# Patient Record
Sex: Female | Born: 1935 | Race: White | Hispanic: No | Marital: Married | Smoking: Never smoker
Health system: Southern US, Community
[De-identification: ages and names within clinical notes are randomized; demographics above are authoritative.]

## PROBLEM LIST (undated history)

## (undated) DIAGNOSIS — M1990 Unspecified osteoarthritis, unspecified site: Secondary | ICD-10-CM

## (undated) DIAGNOSIS — R569 Unspecified convulsions: Secondary | ICD-10-CM

## (undated) DIAGNOSIS — I10 Essential (primary) hypertension: Secondary | ICD-10-CM

## (undated) DIAGNOSIS — F32A Depression, unspecified: Secondary | ICD-10-CM

## (undated) DIAGNOSIS — I509 Heart failure, unspecified: Secondary | ICD-10-CM

## (undated) DIAGNOSIS — E039 Hypothyroidism, unspecified: Secondary | ICD-10-CM

## (undated) DIAGNOSIS — F329 Major depressive disorder, single episode, unspecified: Secondary | ICD-10-CM

## (undated) DIAGNOSIS — E785 Hyperlipidemia, unspecified: Secondary | ICD-10-CM

## (undated) DIAGNOSIS — M109 Gout, unspecified: Secondary | ICD-10-CM

## (undated) DIAGNOSIS — N189 Chronic kidney disease, unspecified: Secondary | ICD-10-CM

## (undated) DIAGNOSIS — J449 Chronic obstructive pulmonary disease, unspecified: Secondary | ICD-10-CM

## (undated) HISTORY — PX: APPENDECTOMY: SHX54

## (undated) HISTORY — DX: Heart failure, unspecified: I50.9

## (undated) HISTORY — DX: Depression, unspecified: F32.A

## (undated) HISTORY — PX: PARTIAL HIP ARTHROPLASTY: SHX733

## (undated) HISTORY — DX: Hypothyroidism, unspecified: E03.9

## (undated) HISTORY — DX: Unspecified osteoarthritis, unspecified site: M19.90

## (undated) HISTORY — DX: Essential (primary) hypertension: I10

## (undated) HISTORY — DX: Major depressive disorder, single episode, unspecified: F32.9

## (undated) HISTORY — DX: Chronic obstructive pulmonary disease, unspecified: J44.9

## (undated) HISTORY — DX: Unspecified convulsions: R56.9

## (undated) HISTORY — DX: Gout, unspecified: M10.9

## (undated) HISTORY — PX: CHOLECYSTECTOMY: SHX55

## (undated) HISTORY — DX: Chronic kidney disease, unspecified: N18.9

## (undated) HISTORY — DX: Hyperlipidemia, unspecified: E78.5

---

## 2006-09-22 ENCOUNTER — Other Ambulatory Visit: Payer: Self-pay

## 2006-09-22 ENCOUNTER — Emergency Department: Payer: Self-pay | Admitting: General Practice

## 2006-10-11 ENCOUNTER — Other Ambulatory Visit: Payer: Self-pay

## 2006-10-11 ENCOUNTER — Emergency Department: Payer: Self-pay | Admitting: Emergency Medicine

## 2006-10-24 ENCOUNTER — Other Ambulatory Visit: Payer: Self-pay

## 2006-10-24 ENCOUNTER — Inpatient Hospital Stay: Payer: Self-pay | Admitting: Internal Medicine

## 2007-03-18 ENCOUNTER — Emergency Department: Payer: Self-pay | Admitting: Emergency Medicine

## 2007-03-18 ENCOUNTER — Other Ambulatory Visit: Payer: Self-pay

## 2007-07-04 ENCOUNTER — Ambulatory Visit: Payer: Self-pay | Admitting: Oncology

## 2007-07-28 ENCOUNTER — Ambulatory Visit: Payer: Self-pay | Admitting: Oncology

## 2007-08-03 ENCOUNTER — Ambulatory Visit: Payer: Self-pay | Admitting: Oncology

## 2007-11-27 ENCOUNTER — Emergency Department: Payer: Self-pay | Admitting: Emergency Medicine

## 2007-11-27 ENCOUNTER — Other Ambulatory Visit: Payer: Self-pay

## 2007-12-02 ENCOUNTER — Ambulatory Visit: Payer: Self-pay | Admitting: Oncology

## 2007-12-30 ENCOUNTER — Ambulatory Visit: Payer: Self-pay | Admitting: Oncology

## 2008-01-01 ENCOUNTER — Ambulatory Visit: Payer: Self-pay | Admitting: Oncology

## 2008-01-07 ENCOUNTER — Inpatient Hospital Stay: Payer: Self-pay | Admitting: Internal Medicine

## 2008-01-07 ENCOUNTER — Other Ambulatory Visit: Payer: Self-pay

## 2008-02-09 ENCOUNTER — Ambulatory Visit: Payer: Self-pay | Admitting: Unknown Physician Specialty

## 2008-02-15 ENCOUNTER — Inpatient Hospital Stay: Payer: Self-pay | Admitting: Unknown Physician Specialty

## 2008-03-24 ENCOUNTER — Inpatient Hospital Stay: Payer: Self-pay | Admitting: Internal Medicine

## 2008-03-24 ENCOUNTER — Other Ambulatory Visit: Payer: Self-pay

## 2008-05-05 ENCOUNTER — Emergency Department: Payer: Self-pay | Admitting: Emergency Medicine

## 2008-08-10 ENCOUNTER — Ambulatory Visit: Payer: Self-pay | Admitting: Unknown Physician Specialty

## 2008-08-11 ENCOUNTER — Inpatient Hospital Stay: Payer: Self-pay | Admitting: Unknown Physician Specialty

## 2008-10-11 ENCOUNTER — Ambulatory Visit: Payer: Self-pay | Admitting: Unknown Physician Specialty

## 2009-02-22 ENCOUNTER — Encounter (HOSPITAL_BASED_OUTPATIENT_CLINIC_OR_DEPARTMENT_OTHER): Admission: RE | Admit: 2009-02-22 | Discharge: 2009-04-04 | Payer: Self-pay | Admitting: General Surgery

## 2009-02-24 ENCOUNTER — Ambulatory Visit (HOSPITAL_COMMUNITY): Admission: RE | Admit: 2009-02-24 | Discharge: 2009-02-24 | Payer: Self-pay | Admitting: General Surgery

## 2009-03-30 ENCOUNTER — Inpatient Hospital Stay (HOSPITAL_COMMUNITY): Admission: RE | Admit: 2009-03-30 | Discharge: 2009-04-04 | Payer: Self-pay | Admitting: Orthopedic Surgery

## 2009-04-07 ENCOUNTER — Ambulatory Visit: Payer: Self-pay | Admitting: Family Medicine

## 2009-04-08 ENCOUNTER — Ambulatory Visit: Payer: Self-pay | Admitting: Obstetrics and Gynecology

## 2009-04-20 ENCOUNTER — Inpatient Hospital Stay: Payer: Self-pay | Admitting: Internal Medicine

## 2009-05-02 ENCOUNTER — Emergency Department: Payer: Self-pay | Admitting: Emergency Medicine

## 2009-06-14 ENCOUNTER — Emergency Department: Payer: Self-pay | Admitting: Emergency Medicine

## 2009-10-31 ENCOUNTER — Emergency Department: Payer: Self-pay | Admitting: Internal Medicine

## 2010-05-31 ENCOUNTER — Inpatient Hospital Stay: Payer: Self-pay | Admitting: Internal Medicine

## 2010-06-14 ENCOUNTER — Emergency Department: Payer: Self-pay | Admitting: Emergency Medicine

## 2010-07-09 ENCOUNTER — Emergency Department: Payer: Self-pay | Admitting: Emergency Medicine

## 2010-07-29 IMAGING — CR DG HIP 1V PORT*R*
1 series · 1 of 1 positions shown · non-contrast
Comparison: 02/24/2009.

CLINICAL DATA: Abscess.  Status post right hip revision.

PORTABLE RIGHT HIP - 1 VIEW

[view not recorded]
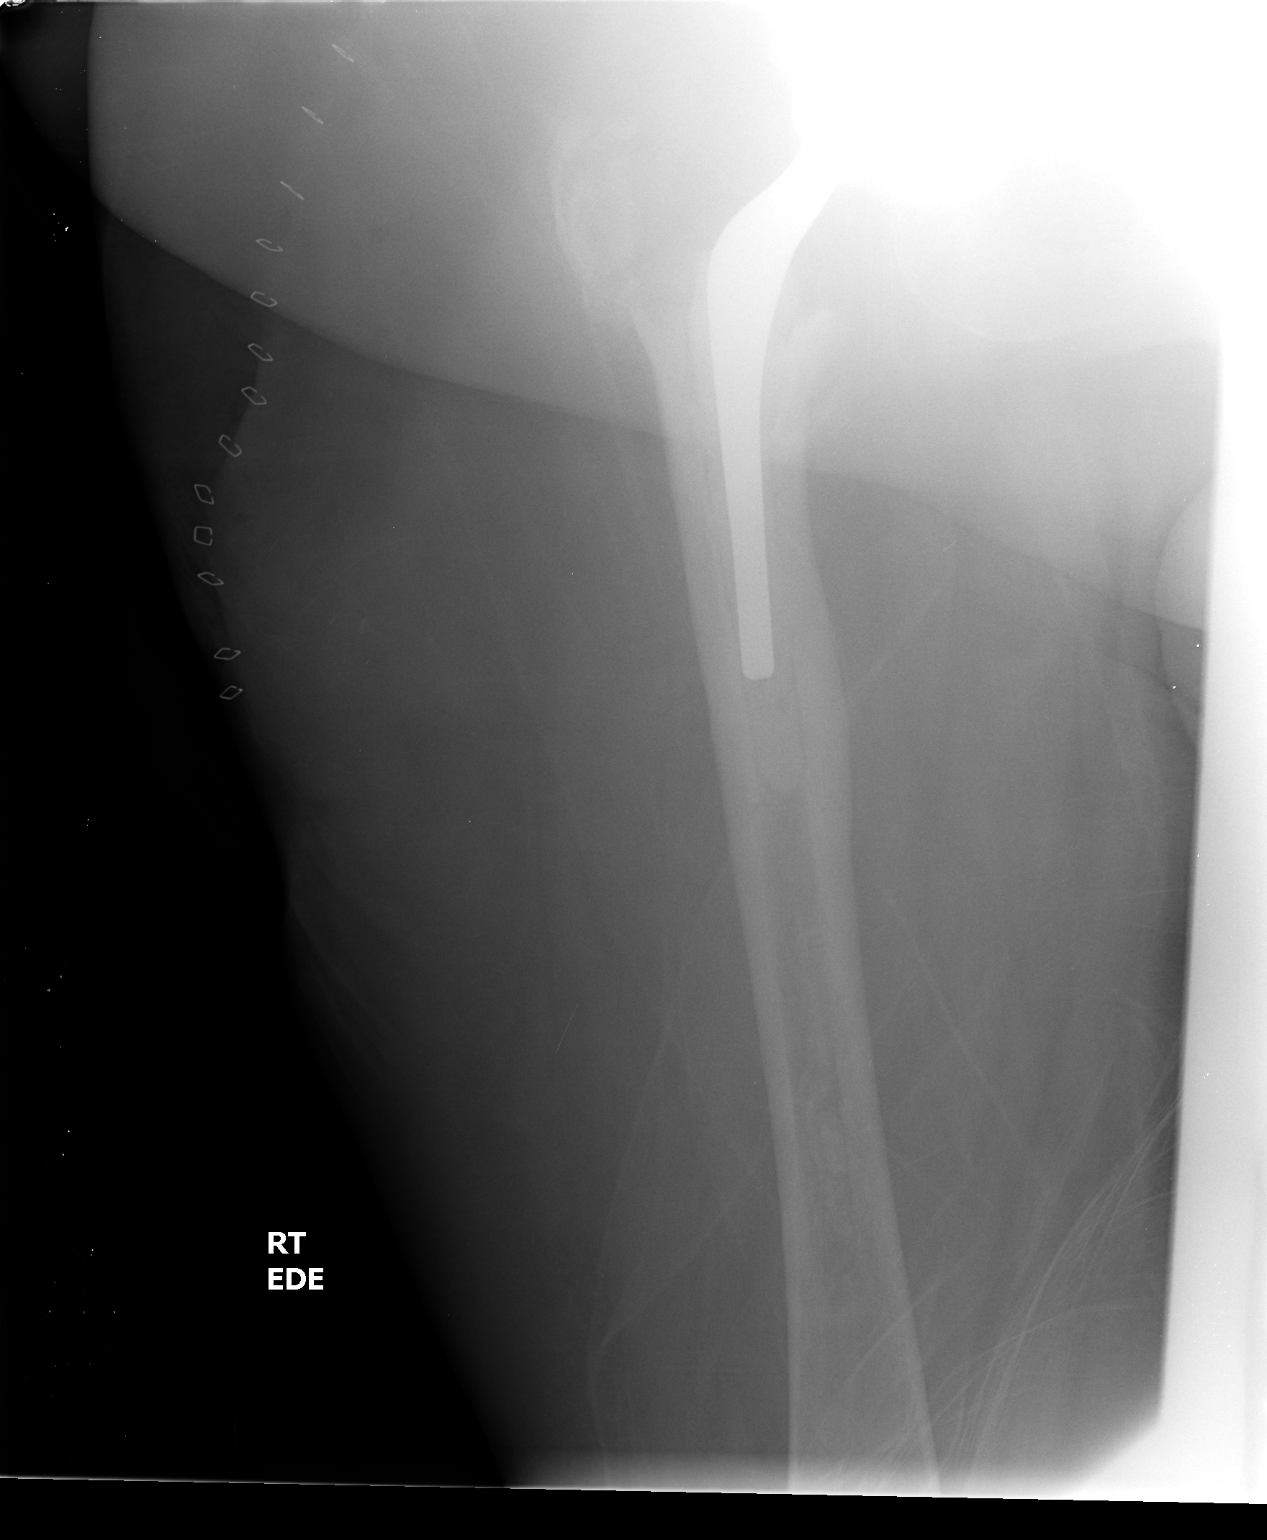

[1 of 1 positions shown; findings below may reference images not displayed]

FINDINGS: Right hip prosthesis has been revised.  Lucency
surrounding the acetabular component may represent nonradiopaque
aspect of this prosthesis.  Femoral component imbedded in
radiopaque material.  Right greater trochanter displaced laterally.
IMPRESSION: Revision right hip prosthesis.

Right trochanter is displaced laterally.

## 2010-08-01 IMAGING — CR DG CHEST 1V PORT
1 series · 1 of 1 positions shown · non-contrast
Comparison: 03/31/2009

CLINICAL DATA: Evaluate PICC line placement.  History of an
abscess.

PORTABLE CHEST - 1 VIEW

[view not recorded]
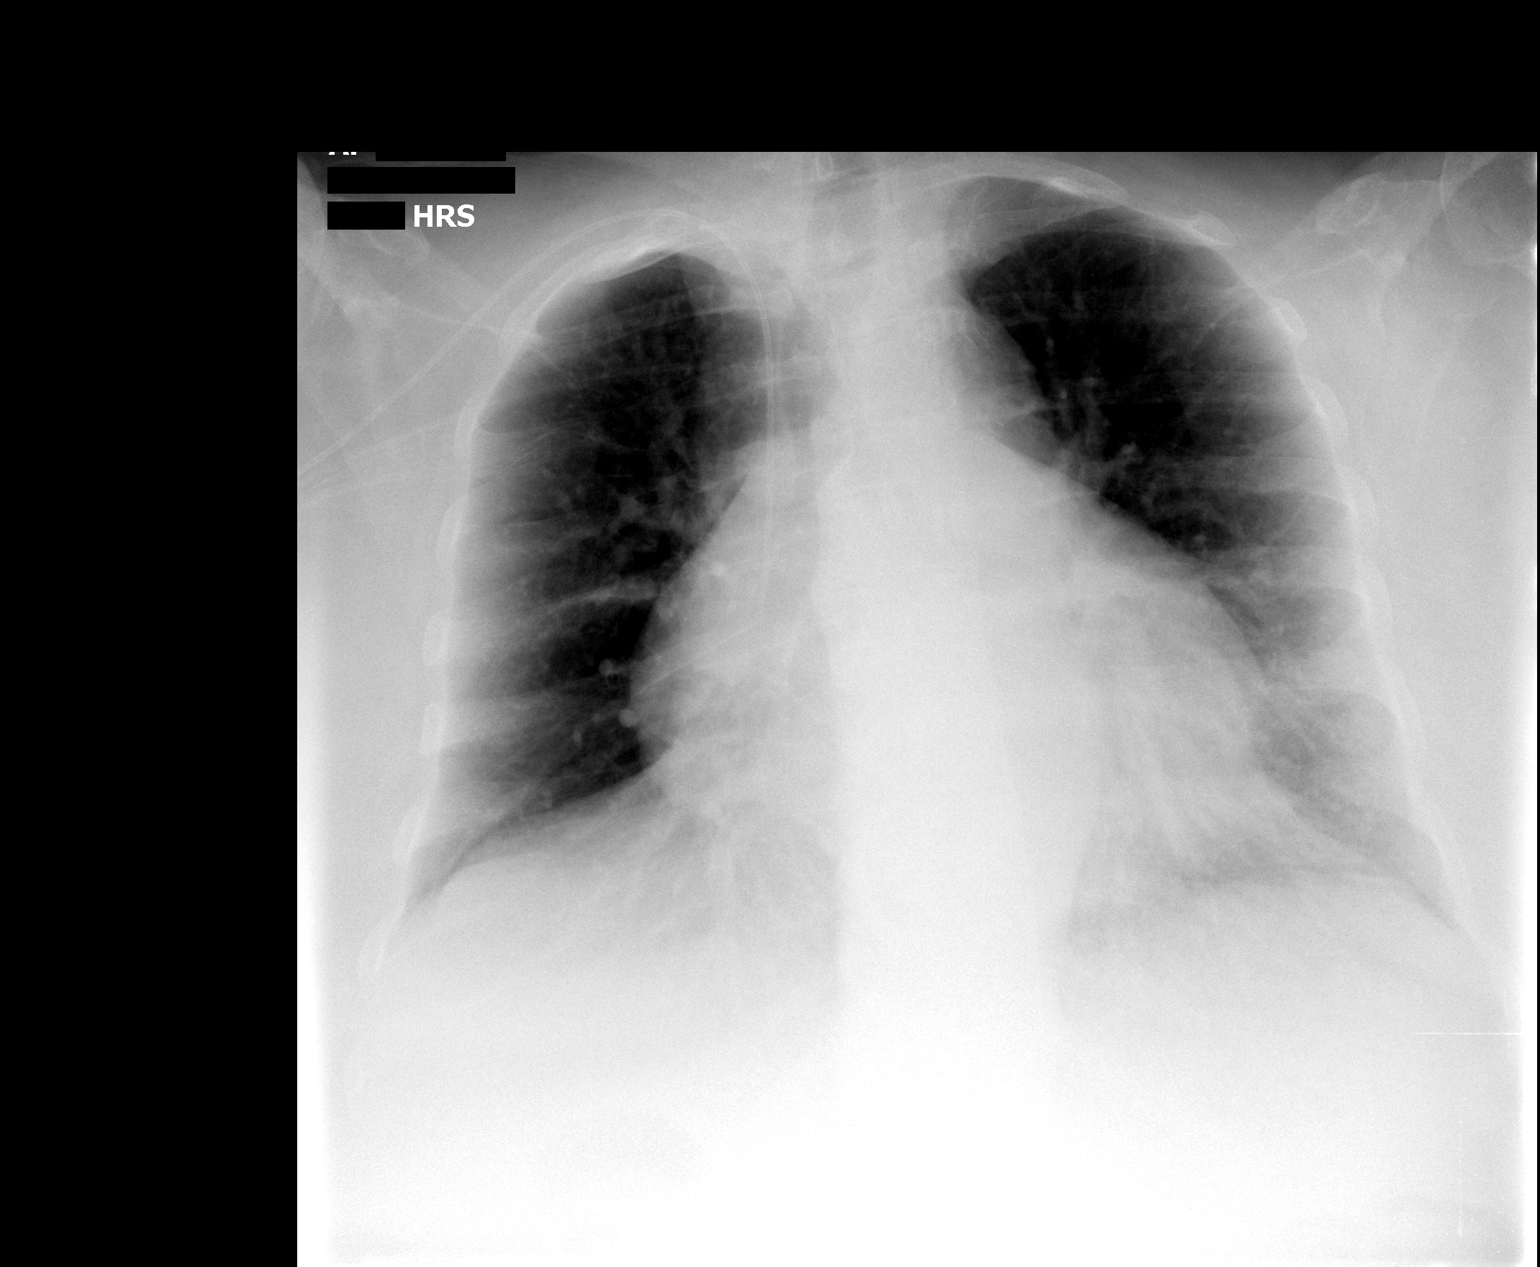

[1 of 1 positions shown; findings below may reference images not displayed]

FINDINGS: Portable view of the chest demonstrates a right sided
PICC line.  The PICC line terminates in the distal SVC.  No
significant change in the lung fields.  Cardiac silhouette remains
upper limits of normal.  Difficult to exclude atelectasis in the
left lung base.
IMPRESSION: PICC line tip in the distal SVC.

## 2010-12-08 LAB — GLUCOSE, CAPILLARY
Glucose-Capillary: 105 mg/dL — ABNORMAL HIGH (ref 70–99)
Glucose-Capillary: 107 mg/dL — ABNORMAL HIGH (ref 70–99)
Glucose-Capillary: 109 mg/dL — ABNORMAL HIGH (ref 70–99)
Glucose-Capillary: 111 mg/dL — ABNORMAL HIGH (ref 70–99)
Glucose-Capillary: 119 mg/dL — ABNORMAL HIGH (ref 70–99)
Glucose-Capillary: 120 mg/dL — ABNORMAL HIGH (ref 70–99)
Glucose-Capillary: 125 mg/dL — ABNORMAL HIGH (ref 70–99)

## 2010-12-08 LAB — PROTIME-INR
INR: 1.3 (ref 0.00–1.49)
Prothrombin Time: 16.8 seconds — ABNORMAL HIGH (ref 11.6–15.2)
Prothrombin Time: 21.6 seconds — ABNORMAL HIGH (ref 11.6–15.2)

## 2010-12-08 LAB — CBC
MCHC: 34.3 g/dL (ref 30.0–36.0)
MCV: 90.4 fL (ref 78.0–100.0)
Platelets: 121 10*3/uL — ABNORMAL LOW (ref 150–400)
RBC: 3.18 MIL/uL — ABNORMAL LOW (ref 3.87–5.11)
RDW: 16.3 % — ABNORMAL HIGH (ref 11.5–15.5)

## 2010-12-08 LAB — BASIC METABOLIC PANEL
BUN: 28 mg/dL — ABNORMAL HIGH (ref 6–23)
CO2: 24 mEq/L (ref 19–32)
Calcium: 8.4 mg/dL (ref 8.4–10.5)
Creatinine, Ser: 1.08 mg/dL (ref 0.4–1.2)
GFR calc Af Amer: 60 mL/min — ABNORMAL LOW (ref >60)

## 2010-12-09 LAB — HEMOGLOBIN A1C
Hgb A1c MFr Bld: 5.4 % (ref 4.6–6.1)
Mean Plasma Glucose: 108 mg/dL

## 2010-12-09 LAB — BASIC METABOLIC PANEL
CO2: 26 mEq/L (ref 19–32)
Calcium: 7.7 mg/dL — ABNORMAL LOW (ref 8.4–10.5)
Calcium: 7.8 mg/dL — ABNORMAL LOW (ref 8.4–10.5)
Chloride: 103 mEq/L (ref 96–112)
Creatinine, Ser: 0.86 mg/dL (ref 0.4–1.2)
GFR calc Af Amer: 53 mL/min — ABNORMAL LOW (ref >60)
GFR calc Af Amer: >60 mL/min (ref >60)
GFR calc non Af Amer: >60 mL/min (ref >60)
Glucose, Bld: 121 mg/dL — ABNORMAL HIGH (ref 70–99)
Sodium: 131 mEq/L — ABNORMAL LOW (ref 135–145)
Sodium: 134 mEq/L — ABNORMAL LOW (ref 135–145)

## 2010-12-09 LAB — CROSSMATCH

## 2010-12-09 LAB — COMPREHENSIVE METABOLIC PANEL
AST: 24 U/L (ref 0–37)
Albumin: 3.4 g/dL — ABNORMAL LOW (ref 3.5–5.2)
Alkaline Phosphatase: 172 U/L — ABNORMAL HIGH (ref 39–117)
BUN: 33 mg/dL — ABNORMAL HIGH (ref 6–23)
CO2: 27 mEq/L (ref 19–32)
Chloride: 108 mEq/L (ref 96–112)
Creatinine, Ser: 0.89 mg/dL (ref 0.4–1.2)
GFR calc Af Amer: >60 mL/min (ref >60)
GFR calc non Af Amer: >60 mL/min (ref >60)
Potassium: 4.5 mEq/L (ref 3.5–5.1)
Total Bilirubin: 0.3 mg/dL (ref 0.3–1.2)

## 2010-12-09 LAB — CBC
HCT: 30.6 % — ABNORMAL LOW (ref 36.0–46.0)
Hemoglobin: 7.8 g/dL — CL (ref 12.0–15.0)
Hemoglobin: 9.9 g/dL — ABNORMAL LOW (ref 12.0–15.0)
MCHC: 34.8 g/dL (ref 30.0–36.0)
MCV: 90.5 fL (ref 78.0–100.0)
MCV: 91.4 fL (ref 78.0–100.0)
Platelets: 169 10*3/uL (ref 150–400)
Platelets: 249 10*3/uL (ref 150–400)
RBC: 2.48 MIL/uL — ABNORMAL LOW (ref 3.87–5.11)
RBC: 3.2 MIL/uL — ABNORMAL LOW (ref 3.87–5.11)
RBC: 3.35 MIL/uL — ABNORMAL LOW (ref 3.87–5.11)
RDW: 16.2 % — ABNORMAL HIGH (ref 11.5–15.5)
RDW: 16.5 % — ABNORMAL HIGH (ref 11.5–15.5)
RDW: 16.6 % — ABNORMAL HIGH (ref 11.5–15.5)
WBC: 7.2 10*3/uL (ref 4.0–10.5)
WBC: 9.7 10*3/uL (ref 4.0–10.5)

## 2010-12-09 LAB — GRAM STAIN

## 2010-12-09 LAB — GLUCOSE, CAPILLARY
Glucose-Capillary: 103 mg/dL — ABNORMAL HIGH (ref 70–99)
Glucose-Capillary: 131 mg/dL — ABNORMAL HIGH (ref 70–99)
Glucose-Capillary: 145 mg/dL — ABNORMAL HIGH (ref 70–99)
Glucose-Capillary: 167 mg/dL — ABNORMAL HIGH (ref 70–99)
Glucose-Capillary: 90 mg/dL (ref 70–99)
Glucose-Capillary: 97 mg/dL (ref 70–99)

## 2010-12-09 LAB — ANAEROBIC CULTURE

## 2010-12-09 LAB — POCT I-STAT 4, (NA,K, GLUC, HGB,HCT)
Glucose, Bld: 148 mg/dL — ABNORMAL HIGH (ref 70–99)
HCT: 23 % — ABNORMAL LOW (ref 36.0–46.0)
HCT: 27 % — ABNORMAL LOW (ref 36.0–46.0)
Hemoglobin: 7.8 g/dL — CL (ref 12.0–15.0)
Sodium: 135 mEq/L (ref 135–145)

## 2010-12-09 LAB — VANCOMYCIN, TROUGH: Vancomycin Tr: 18.3 ug/mL (ref 10.0–20.0)

## 2010-12-09 LAB — WOUND CULTURE

## 2010-12-09 LAB — ABO/RH: ABO/RH(D): A POS

## 2010-12-09 LAB — PROTIME-INR
INR: 1.4 (ref 0.00–1.49)
Prothrombin Time: 16.2 seconds — ABNORMAL HIGH (ref 11.6–15.2)

## 2011-01-15 NOTE — Assessment & Plan Note (Signed)
[facility]   NAME:  Gabrielle Sanders, Gabrielle Sanders            ACCOUNT NO.:  [id number]   MEDICAL RECORD NO.:  [id number]      DATE OF BIRTH:  04/14/1931   PHYSICIAN:  Maxwell Caul, M.D. VISIT DATE:  03/02/2009                                   OFFICE VISIT   HISTORY:  [name] was seen last week by Dr. Wiliam Ke.  She is 77-  year-old woman who underwent a total hip replacement 1 year ago at  [facility] by Dr. Gerrit Heck.  The wound never properly healed.  It  was apparently drained several months ago and then she had an elective  opening of the wound where she was treated with a wound vac but this has  still left her with a draining sinus with considerable amount of depth.   Dr. Wiliam Ke biopsied some of the surrounding tissue which simply showed  benign soft tissue with acute inflammation and necrosis.  A culture of  the drainage grew few staph aureus (MSSA).  She is currently on  doxycycline.   A fistulogram was done at [facility].  This shows a  fistula that extends from the skin surface all the way down to the  lateral aspect of the proximal right femur where there was a collection  in the deep soft tissues, likely a chronic abscess cavity.  It does not  appear to connect directly with the hip joint as there is no contrast  extension into the hip joint space.   PHYSICAL EXAMINATION:  The wound itself is a very tiny wound but probes  very deeply.  Our measurements today were 0.5 x 0.5 x 6.5.  This does  not probe to bone.  Palpation around the soft tissue results in  purulence from the fistula tract opening.  There is no gross soft tissue  involvement.  She does not appear to be systemically ill with a  temperature of 98.6.   IMPRESSION:  Fistula right extending from the surface of the skin in the  middle part of incision down the lateral aspect of proximal right femur  where it connects likely with an abscess cavity.  I think this will  need  to be reopened.  I do not think antibiotics will ultimately result in  healing here.  I am going to refer her to orthopedics for an opinion.  The patient is adamantly against returning to see Dr. Gerrit Heck.  I will  refer her to see Dr. Lajoyce Corners.  She does have an appointment in [place]  on May 04, 2009, although I do not think this should be left that  long for definitive surgical treatment.           ______________________________  Maxwell Caul, M.D.     MGR/MEDQ  D:  03/02/2009  T:  03/03/2009  Job:  [id number]

## 2011-01-15 NOTE — Op Note (Signed)
NAME:  Gabrielle Sanders, Gabrielle Sanders            ACCOUNT NO.:  [id number]   MEDICAL RECORD NO.:  [id number]          PATIENT TYPE:  INP   LOCATION:  5038                         FACILITY:  [facility]   PHYSICIAN:  Nadara Mustard, MD     DATE OF BIRTH:  1931/05/23   DATE OF PROCEDURE:  03/30/2009  DATE OF DISCHARGE:                               OPERATIVE REPORT   PREOPERATIVE DIAGNOSIS:  Septic right total hip arthroplasty.   POSTOPERATIVE DIAGNOSIS:  Septic right total hip arthroplasty.   PROCEDURES:  1. Irrigation and debridement of large septic abscess, right hip.  2. Removal of right total hip arthroplasty including femoral      component, which was cemented acetabular component.  3. Placement of an antibiotic-coated total hip replacement with a 42-      mm acetabulum cemented in place with bone cement and 1 g of      tobramycin and 1 g of vancomycin.  4. Implantation of a size 1 stem coated with 1 g of vancomycin and 1 g      of tobramycin and placement of +9 neck and a 32-mm semiconstrained      head.   SURGEON:  Nadara Mustard, MD   ANESTHESIA:  General.   ESTIMATED BLOOD LOSS:  1000 mL.   ANTIBIOTICS:  One gram of Kefzol.   DRAINS:  None.   COMPLICATIONS:  None.   The patient received 2 units of packed red blood cells.   DISPOSITION:  To PACU in stable condition.   INDICATION OF PROCEDURE:  The patient is a 77 year old woman who is 1-  year status post a total hip arthroplasty in [place], [place].  The patient was treated postoperatively with a draining sinus tract with  wound care, VAC placement and treatment at a [facility].  Radiograph showed lucency around the implants consistent with the 1-year  history of infection and the patient presents at this time for the above-  mentioned procedure.  Risks and benefits were discussed including  persistent infection, neurovascular injury, injury to the sciatic nerve,  dislocation of the hip, DVT, pulmonary embolus,  mortality, need for  additional surgery.  The patient and family state they understand and  wished to proceed at this time.   DESCRIPTION OF PROCEDURE:  The patient was brought to OR room 15,  underwent general anesthetic.  After adequate level of anesthesia  obtained, the patient was placed in left lateral decubitus position with  the right side up and the right lower extremities prepped using DuraPrep  and draped in the sterile field.  Ioban used to cover all exposed skin.  The draining sinus tract was ellipsed out in 1 block of tissue.  The  posterolateral incision was used and this was carried down through the  tensor fascia lata, which was split.  The Ethibond sutures were removed.  Visualization showed a massive deep abscess.  Cultures were obtained.  This was irrigated with pulsatile lavage.  There was seaweed-like frondy  inflammatory tissue, which was excised.  There was 4 large heterotopic  ossification masses, which were also excised.  Attention  was focused  first on the femur.  Using the [facility] instruments, the instruments  were taken around the femur and the femur was then extracted with the  extraction tool.  Further tools were used to remove the cement mantle  with reaming, irrigation, debridement and the canal was broached for a  size 2 stem.  Attention was then focused on the acetabulum.  The  acetabular poly liner was removed.  The 2 locking screws were removed  and the acetabulum came out quite easily after the screws were removed.  There was no interdigitation into the acetabular component.  The  acetabulum was curetted back to bleeding viable subchondral bone.  The  wounds were again irrigated with pulsatile lavage.  There was a slight  crack in the greater trochanter, but this was still attached with the  lateral ligamentous structures.  All heterotopic ossification masses  were also removed.  After further irrigation, the components were  fabricated on the back  table.  The cement mantle with antibiotics was  placed in the mold for the femur and a component of cement was used for  the acetabular poly liner.  The femur was press fit in place with the  cement mantle with approximately 20 degrees of anteversion.  After the  cement had almost completely hardened, the acetabular component was  press fit with 40 degrees of abduction and 20 degrees of anteversion.  After the cement had hardened, this was trialed with 7.5 and this had a  good length.  The +8 neck and the 32-mm head were inserted.  The hip was  reduced.  The hip was placed through full range of motion.  There was  good stability.  The patient had flexion down to 20 adduction and  internal rotation of 70 degrees.  The wounds were again further  irrigated.  The tensor fascia lata tissue was closed using a 2-0  monofilament PDS.  The subcu was closed using a 2-0 monofilament PDS.  Skin was closed using Proximate staples.  The wound was covered with  Adaptic orthopedic sponges, ABD dressing, and Hypafix tape.  The patient  was extubated, taken to PACU in stable condition.      Nadara Mustard, MD  Electronically Signed     MVD/MEDQ  D:  03/30/2009  T:  03/31/2009  Job:  [id number] [id number]

## 2011-01-15 NOTE — H&P (Signed)
NAME:  Gabrielle Sanders, Gabrielle Sanders            ACCOUNT NO.:  [id number]   MEDICAL RECORD NO.:  [id number]          PATIENT TYPE:  REC   LOCATION:  FOOT                         FACILITY:  [facility]   PHYSICIAN:  Joanne Gavel, M.D.        DATE OF BIRTH:  [birth year]   DATE OF ADMISSION:  02/22/2009  DATE OF DISCHARGE:                              HISTORY & PHYSICAL   CHIEF COMPLAINT:  Draining wound.   HISTORY OF PRESENT ILLNESS:  This is a 77 year old female who underwent  total hip replacement 1 year ago at [facility] by Dr.  Gerrit Heck.  This wound has never properly healed.  It was drained as an  emergency several months ago, then she had an elective operation where  the wound was opened widely and she was treated with a VAC, but this  still has not resulted in healing.  Several days ago during the course  of packing, a piece of tissue came out.  I have biopsied this tissue  after tying it up with a 2-0 chromic, it appears to be fibrous tissue.   PAST MEDICAL HISTORY:  She has chronic leukemia and has had a stroke a  year ago.  She has been treated for congestive heart failure, high blood  pressure, MI.  She also has some bladder incontinence and difficulty  swallowing.  She has hypothyroidism.   MEDICATIONS:  Levothyroxine, Cozaar, folic acid, furosemide, Spiriva,  Nexium, senna, fexofenadine, allopurinol, B12, Dilantin, Klonopin,  Tylenol, and Percocet.   ALLERGIES:  None.   PAST SURGICAL HISTORY:  She had appendectomy, cholecystectomy, and hip  surgery as above, also tonsillectomy.   FAMILY HISTORY:  Noncontributory.   PHYSICAL EXAMINATION:  VITAL SIGNS:  Temperature 98.3, pulse 72,  respirations 16, blood pressure 140/80.  GENERAL APPEARANCE:  Slightly obese, in no distress.  HEENT:  Normocephalic.  NECK:  Supple.  No murmurs.  CHEST:  Clear.  HEART:  Regular rhythm.  ABDOMEN:  At least grossly normal.  EXTREMITIES:  Not examined.  There is a wound on her right hip in the  midst of a hip replacement wound through which is coming some fibrous  tissue.  This tissue was biopsied after being tied up with 2-0 chromic.  The wound has serous drainage which is not odorous and the wound can be  probed for at least 15 cm.   IMPRESSION:  Failure of healing of a hip replacement wound.   PLAN:  Pack that with silver alginate.  Routine blood work.  I have  ordered a Gastrografin injection of the wound tract to see the size of  the cavity and the location.  I am sure that this patient will require  surgery in the operating room.  We will see her in 1 week and probably  will require general plastic or orthopedic consultation.      Joanne Gavel, M.D.  Electronically Signed     RA/MEDQ  D:  02/22/2009  T:  02/23/2009  Job:  [id number]

## 2011-01-15 NOTE — Discharge Summary (Signed)
NAME:  Gabrielle Sanders, Gabrielle Sanders            ACCOUNT NO.:  [id number]   MEDICAL RECORD NO.:  [id number]          PATIENT TYPE:  INP   LOCATION:  5038                         FACILITY:  [facility]   PHYSICIAN:  Nadara Mustard, MD     DATE OF BIRTH:  1931/06/24   DATE OF ADMISSION:  03/30/2009  DATE OF DISCHARGE:  04/04/2009                               DISCHARGE SUMMARY   FINAL DIAGNOSIS:  Septic arthritis, right total hip arthroplasty.   PROCEDURES:  1. Removal of total hip arthroplasty including acetabular and femoral      components.  2. Insertion of an antibiotic impregnated total hip arthroplasty with      both acetabular and femoral component.   Discharged to skilled nursing in stable condition.   Physical therapy, progressive ambulation, weightbearing as tolerated on  the right.  Total hip precautions.  Follow up with Dr. Lajoyce Corners 3 weeks  after discharge.   Discharge medications include vancomycin 6 weeks through PICC line dosed  per pharmacy.   ADMISSION MEDICATIONS:  1. Synthroid 25 mcg one-half tablet daily.  2. Cozaar 50 mg one daily.  3. Allegra 60 mg one daily.  4. Folic acid 1 mg daily.  5. Lasix 40 mg daily p.r.n.  6. Sertraline 100 mg one-half tablets daily.  7. Spiriva inhaler p.r.n.  8. Nexium 40 mg daily.  9. Senna plus 8.6 mg one b.i.d.  10.Lovastatin 20 mg p.o. q.p.m.  11.Elavil 50 mg at bedtime.  12.Allopurinol 100 mg p.o. q.a.m.  13.Vitamin B12 injection monthly.  14.Dilantin 300 mg p.o. at bedtime.  15.Tylenol 325 mg p.r.n.  16.Klonopin 0.25 mg one-half tablet at bedtime.  17.Milk of magnesia p.r.n.  18.MiraLax capful p.r.n. daily.  19.Stool softener 2 at bedtime p.r.n.  20.Albuterol inhaler q.a.m. and p.r.n.  21.Tylox 1-2 p.o. q.4 h. p.r.n. for pain.  22.Sliding scale NovoLog insulin; CBG 121-150 two units, CBG 151-200      three units, CBG 201-250 five units, CBG 251-300 eight units, CBG      301-350 eleven units, CBG 351-400 fifteen units.  23.Coumadin 1  mg p.o. daily x4 weeks for DVT prophylaxis.  24.Currently vancomycin dose 750 mg IV q.12 h.   HISTORY OF PRESENT ILLNESS:  The patient is a 77 year old woman who is  approximately year status post a right total hip arthroplasty.  She has  had persistent drainage since her surgery.  She was treated  conservatively without resolution of the infection and presented at this  time for revision for the infected total hip arthroplasty.  The  patient's hospital course was essentially unremarkable.  She underwent  her surgery on March 30, 2009, with removal of the total hip components  and implantation of antibiotic cement laden total hip replacement.  She  received 2 units of packed red blood cells intraoperatively.  Cultures  were obtained x2.  The patient postoperatively progressed well.  She did  collateral PICC line accidentally and a PICC line was replaced.  The  patient was not progressing adequately to be able to be discharged to  home and she was scheduled for discharge to skilled nursing facility.  The patient's hemoglobin dropped to 7.8 on April 01, 2009, and she  received 2 additional units of packed red blood cells.  The patient and  family agreed for discharge to short-term skilled nursing.  Her PICC  line was replaced and the patient was discharged to skilled nursing in  stable condition to follow up with Dr. Lajoyce Corners in 3 weeks.      Nadara Mustard, MD  Electronically Signed     MVD/MEDQ  D:  04/04/2009  T:  04/04/2009  Job:  [id number]

## 2011-05-15 ENCOUNTER — Inpatient Hospital Stay: Payer: Self-pay | Admitting: Internal Medicine

## 2011-05-18 DIAGNOSIS — I509 Heart failure, unspecified: Secondary | ICD-10-CM

## 2011-06-02 ENCOUNTER — Emergency Department: Payer: Self-pay | Admitting: Internal Medicine

## 2011-08-05 ENCOUNTER — Ambulatory Visit: Payer: Self-pay | Admitting: Oncology

## 2011-08-13 ENCOUNTER — Ambulatory Visit: Payer: Self-pay | Admitting: Oncology

## 2011-08-22 ENCOUNTER — Ambulatory Visit: Payer: Self-pay | Admitting: Gastroenterology

## 2011-08-26 ENCOUNTER — Inpatient Hospital Stay: Payer: Self-pay | Admitting: *Deleted

## 2011-09-03 ENCOUNTER — Ambulatory Visit: Payer: Self-pay | Admitting: Oncology

## 2011-10-04 ENCOUNTER — Ambulatory Visit: Payer: Self-pay | Admitting: Oncology

## 2011-11-01 ENCOUNTER — Ambulatory Visit: Payer: Self-pay | Admitting: Oncology

## 2011-12-19 ENCOUNTER — Ambulatory Visit: Payer: Self-pay | Admitting: Oncology

## 2012-01-01 ENCOUNTER — Ambulatory Visit: Payer: Self-pay | Admitting: Oncology

## 2012-02-13 ENCOUNTER — Ambulatory Visit: Payer: Self-pay | Admitting: Oncology

## 2012-02-13 LAB — CANCER CENTER HEMOGLOBIN: HGB: 10.3 g/dL — ABNORMAL LOW (ref 12.0–16.0)

## 2012-02-25 ENCOUNTER — Emergency Department: Payer: Self-pay | Admitting: Emergency Medicine

## 2012-02-25 LAB — CBC WITH DIFFERENTIAL/PLATELET
Basophil %: 0.9 %
Eosinophil #: 0 10*3/uL (ref 0.0–0.7)
HCT: 28.8 % — ABNORMAL LOW (ref 35.0–47.0)
Lymphocyte #: 1 10*3/uL (ref 1.0–3.6)
Lymphocyte %: 28.3 %
MCHC: 32.5 g/dL (ref 32.0–36.0)
Monocyte %: 13.1 %
Platelet: 115 10*3/uL — ABNORMAL LOW (ref 150–440)
RDW: 14.1 % (ref 11.5–14.5)

## 2012-02-25 LAB — COMPREHENSIVE METABOLIC PANEL
Albumin: 4.1 g/dL (ref 3.4–5.0)
Alkaline Phosphatase: 137 U/L — ABNORMAL HIGH (ref 50–136)
Anion Gap: 7 (ref 7–16)
BUN: 61 mg/dL — ABNORMAL HIGH (ref 7–18)
Bilirubin,Total: 0.2 mg/dL (ref 0.2–1.0)
Calcium, Total: 8.3 mg/dL — ABNORMAL LOW (ref 8.5–10.1)
Co2: 27 mmol/L (ref 21–32)
Creatinine: 1.64 mg/dL — ABNORMAL HIGH (ref 0.60–1.30)
Glucose: 84 mg/dL (ref 65–99)
Osmolality: 300 (ref 275–301)
Potassium: 4.4 mmol/L (ref 3.5–5.1)
SGOT(AST): 22 U/L (ref 15–37)

## 2012-03-02 ENCOUNTER — Ambulatory Visit: Payer: Self-pay | Admitting: Oncology

## 2012-04-02 ENCOUNTER — Ambulatory Visit: Payer: Self-pay | Admitting: Oncology

## 2012-05-03 ENCOUNTER — Ambulatory Visit: Payer: Self-pay | Admitting: Oncology

## 2012-06-02 ENCOUNTER — Ambulatory Visit: Payer: Self-pay | Admitting: Oncology

## 2012-06-18 ENCOUNTER — Ambulatory Visit: Payer: Self-pay | Admitting: Oncology

## 2012-06-18 LAB — CBC CANCER CENTER
Basophil #: 0 x10 3/mm (ref 0.0–0.1)
Basophil %: 0.9 %
Eosinophil #: 0 x10 3/mm (ref 0.0–0.7)
HGB: 9.2 g/dL — ABNORMAL LOW (ref 12.0–16.0)
Lymphocyte %: 27.5 %
MCHC: 31.4 g/dL — ABNORMAL LOW (ref 32.0–36.0)
Neutrophil %: 57.3 %

## 2012-06-23 ENCOUNTER — Emergency Department: Payer: Self-pay | Admitting: Emergency Medicine

## 2012-06-23 LAB — CBC WITH DIFFERENTIAL/PLATELET
Basophil #: 0 10*3/uL (ref 0.0–0.1)
Basophil %: 0.2 %
Eosinophil #: 0 10*3/uL (ref 0.0–0.7)
Eosinophil %: 0.1 %
HCT: 27.9 % — ABNORMAL LOW (ref 35.0–47.0)
HGB: 9.2 g/dL — ABNORMAL LOW (ref 12.0–16.0)
Lymphocyte #: 0.7 10*3/uL — ABNORMAL LOW (ref 1.0–3.6)
Lymphocyte %: 8.6 %
MCH: 34.6 pg — ABNORMAL HIGH (ref 26.0–34.0)
MCHC: 33.1 g/dL (ref 32.0–36.0)
MCV: 104 fL — ABNORMAL HIGH (ref 80–100)
Monocyte #: 1 x10 3/mm — ABNORMAL HIGH (ref 0.2–0.9)
Monocyte %: 11.2 %
Neutrophil #: 6.8 10*3/uL — ABNORMAL HIGH (ref 1.4–6.5)
Neutrophil %: 79.9 %
Platelet: 136 10*3/uL — ABNORMAL LOW (ref 150–440)
RBC: 2.67 10*6/uL — ABNORMAL LOW (ref 3.80–5.20)
RDW: 14.9 % — ABNORMAL HIGH (ref 11.5–14.5)
WBC: 8.6 10*3/uL (ref 3.6–11.0)

## 2012-06-23 LAB — COMPREHENSIVE METABOLIC PANEL
Albumin: 3.5 g/dL (ref 3.4–5.0)
Anion Gap: 9 (ref 7–16)
BUN: 39 mg/dL — ABNORMAL HIGH (ref 7–18)
Calcium, Total: 7.4 mg/dL — ABNORMAL LOW (ref 8.5–10.1)
Chloride: 112 mmol/L — ABNORMAL HIGH (ref 98–107)
EGFR (African American): 38 — ABNORMAL LOW
Glucose: 114 mg/dL — ABNORMAL HIGH (ref 65–99)
Osmolality: 295 (ref 275–301)
Potassium: 3.7 mmol/L (ref 3.5–5.1)
Sodium: 143 mmol/L (ref 136–145)
Total Protein: 5.9 g/dL — ABNORMAL LOW (ref 6.4–8.2)

## 2012-06-23 LAB — URINALYSIS, COMPLETE
Bacteria: NONE SEEN
Bilirubin,UR: NEGATIVE
Blood: NEGATIVE
Glucose,UR: NEGATIVE mg/dL (ref 0–75)
Ketone: NEGATIVE
Leukocyte Esterase: NEGATIVE
Nitrite: NEGATIVE
Ph: 5 (ref 4.5–8.0)
Protein: NEGATIVE
RBC,UR: 2 /HPF (ref 0–5)
Specific Gravity: 1.011 (ref 1.003–1.030)
Squamous Epithelial: <1
WBC UR: <1 /HPF (ref 0–5)

## 2012-06-29 LAB — CULTURE, BLOOD (SINGLE)

## 2012-07-03 ENCOUNTER — Ambulatory Visit: Payer: Self-pay | Admitting: Oncology

## 2012-07-16 LAB — CBC CANCER CENTER
Basophil %: 0.8 %
Eosinophil %: 3.2 %
HGB: 8.8 g/dL — ABNORMAL LOW (ref 12.0–16.0)
MCH: 33.1 pg (ref 26.0–34.0)
MCV: 105 fL — ABNORMAL HIGH (ref 80–100)
Monocyte %: 14 %
Neutrophil %: 45.6 %
RBC: 2.65 10*6/uL — ABNORMAL LOW (ref 3.80–5.20)
WBC: 3.8 x10 3/mm (ref 3.6–11.0)

## 2012-08-02 ENCOUNTER — Ambulatory Visit: Payer: Self-pay | Admitting: Oncology

## 2012-09-02 ENCOUNTER — Ambulatory Visit: Payer: Self-pay | Admitting: Oncology

## 2012-09-17 LAB — CANCER CENTER HEMOGLOBIN: HGB: 9.7 g/dL — ABNORMAL LOW (ref 12.0–16.0)

## 2012-10-03 ENCOUNTER — Ambulatory Visit: Payer: Self-pay | Admitting: Oncology

## 2012-10-08 LAB — CBC CANCER CENTER
Basophil #: 0 x10 3/mm (ref 0.0–0.1)
Basophil %: 1 %
Eosinophil #: 0 x10 3/mm (ref 0.0–0.7)
Eosinophil %: 1.4 %
HCT: 32.2 % — ABNORMAL LOW (ref 35.0–47.0)
RBC: 3.13 10*6/uL — ABNORMAL LOW (ref 3.80–5.20)

## 2012-10-12 ENCOUNTER — Emergency Department: Payer: Self-pay | Admitting: Unknown Physician Specialty

## 2012-10-12 LAB — CBC WITH DIFFERENTIAL/PLATELET
Basophil %: 1.1 %
HGB: 10.7 g/dL — ABNORMAL LOW (ref 12.0–16.0)
MCH: 33.7 pg (ref 26.0–34.0)
Monocyte %: 10.9 %
Neutrophil #: 2.2 10*3/uL (ref 1.4–6.5)
RDW: 15.3 % — ABNORMAL HIGH (ref 11.5–14.5)
WBC: 3.6 10*3/uL (ref 3.6–11.0)

## 2012-10-12 LAB — PROTIME-INR
INR: 1
Prothrombin Time: 13.1 secs (ref 11.5–14.7)

## 2012-10-12 LAB — BASIC METABOLIC PANEL
Anion Gap: 7 (ref 7–16)
BUN: 57 mg/dL — ABNORMAL HIGH (ref 7–18)
Calcium, Total: 8.8 mg/dL (ref 8.5–10.1)
Co2: 24 mmol/L (ref 21–32)
Osmolality: 300 (ref 275–301)
Potassium: 4.2 mmol/L (ref 3.5–5.1)

## 2012-10-31 ENCOUNTER — Ambulatory Visit: Payer: Self-pay | Admitting: Oncology

## 2012-12-01 ENCOUNTER — Ambulatory Visit: Payer: Self-pay | Admitting: Oncology

## 2012-12-03 LAB — CANCER CENTER HEMOGLOBIN: HGB: 10 g/dL — ABNORMAL LOW (ref 12.0–16.0)

## 2012-12-31 ENCOUNTER — Ambulatory Visit: Payer: Self-pay | Admitting: Oncology

## 2013-01-31 ENCOUNTER — Ambulatory Visit: Payer: Self-pay | Admitting: Oncology

## 2013-03-10 ENCOUNTER — Ambulatory Visit: Payer: Self-pay | Admitting: Oncology

## 2013-04-02 ENCOUNTER — Ambulatory Visit: Payer: Self-pay | Admitting: Oncology

## 2013-05-03 ENCOUNTER — Ambulatory Visit: Payer: Self-pay | Admitting: Oncology

## 2013-06-03 ENCOUNTER — Ambulatory Visit: Payer: Self-pay | Admitting: Oncology

## 2013-07-03 ENCOUNTER — Ambulatory Visit: Payer: Self-pay | Admitting: Oncology

## 2013-07-15 LAB — CANCER CENTER HEMOGLOBIN: HGB: 9.7 g/dL — ABNORMAL LOW (ref 12.0–16.0)

## 2013-08-02 ENCOUNTER — Ambulatory Visit: Payer: Self-pay | Admitting: Oncology

## 2013-09-30 ENCOUNTER — Ambulatory Visit: Payer: Self-pay | Admitting: Oncology

## 2013-09-30 LAB — CANCER CENTER HEMOGLOBIN: HGB: 9.6 g/dL — ABNORMAL LOW (ref 12.0–16.0)

## 2013-10-03 ENCOUNTER — Ambulatory Visit: Payer: Self-pay | Admitting: Oncology

## 2013-11-24 ENCOUNTER — Ambulatory Visit: Payer: Self-pay | Admitting: Oncology

## 2013-11-25 LAB — CANCER CENTER HEMOGLOBIN: HGB: 10.2 g/dL — ABNORMAL LOW (ref 12.0–16.0)

## 2013-12-01 ENCOUNTER — Ambulatory Visit: Payer: Self-pay | Admitting: Oncology

## 2013-12-23 LAB — CANCER CENTER HEMOGLOBIN: HGB: 9.3 g/dL — AB (ref 12.0–16.0)

## 2013-12-31 ENCOUNTER — Ambulatory Visit: Payer: Self-pay | Admitting: Oncology

## 2014-01-27 LAB — CBC CANCER CENTER
Basophil #: 0 x10 3/mm (ref 0.0–0.1)
Basophil %: 1 %
Eosinophil #: 0.1 x10 3/mm (ref 0.0–0.7)
Eosinophil %: 2.1 %
HCT: 32.5 % — ABNORMAL LOW (ref 35.0–47.0)
HGB: 10.6 g/dL — ABNORMAL LOW (ref 12.0–16.0)
LYMPHS PCT: 16.7 %
Lymphocyte #: 0.7 x10 3/mm — ABNORMAL LOW (ref 1.0–3.6)
MCH: 34.8 pg — ABNORMAL HIGH (ref 26.0–34.0)
MCHC: 32.6 g/dL (ref 32.0–36.0)
MCV: 107 fL — ABNORMAL HIGH (ref 80–100)
MONO ABS: 0.4 x10 3/mm (ref 0.2–0.9)
MONOS PCT: 10.2 %
NEUTROS ABS: 3 x10 3/mm (ref 1.4–6.5)
NEUTROS PCT: 70 %
Platelet: 145 x10 3/mm — ABNORMAL LOW (ref 150–440)
RBC: 3.04 10*6/uL — AB (ref 3.80–5.20)
RDW: 14.1 % (ref 11.5–14.5)
WBC: 4.2 x10 3/mm (ref 3.6–11.0)

## 2014-01-31 ENCOUNTER — Ambulatory Visit: Payer: Self-pay | Admitting: Oncology

## 2014-03-08 ENCOUNTER — Encounter: Payer: Self-pay | Admitting: Family Medicine

## 2014-03-29 ENCOUNTER — Ambulatory Visit: Payer: Self-pay | Admitting: Oncology

## 2014-03-29 LAB — CANCER CENTER HEMOGLOBIN: HGB: 9.7 g/dL — AB (ref 12.0–16.0)

## 2014-04-02 ENCOUNTER — Encounter: Payer: Self-pay | Admitting: Family Medicine

## 2014-04-02 ENCOUNTER — Ambulatory Visit: Payer: Self-pay | Admitting: Oncology

## 2014-05-20 ENCOUNTER — Emergency Department: Payer: Self-pay | Admitting: Emergency Medicine

## 2014-05-20 LAB — URINALYSIS, COMPLETE
BILIRUBIN, UR: NEGATIVE
BLOOD: NEGATIVE
Bacteria: NONE SEEN
Glucose,UR: NEGATIVE mg/dL (ref 0–75)
Hyaline Cast: 10
Ketone: NEGATIVE
LEUKOCYTE ESTERASE: NEGATIVE
NITRITE: NEGATIVE
PROTEIN: NEGATIVE
Ph: 5 (ref 4.5–8.0)
RBC,UR: <1 /HPF (ref 0–5)
Specific Gravity: 1.011 (ref 1.003–1.030)
Squamous Epithelial: 2
WBC UR: 1 /HPF (ref 0–5)

## 2014-05-20 LAB — CBC
HCT: 27.6 % — AB (ref 35.0–47.0)
HGB: 8.7 g/dL — ABNORMAL LOW (ref 12.0–16.0)
MCH: 33.7 pg (ref 26.0–34.0)
MCHC: 31.6 g/dL — AB (ref 32.0–36.0)
MCV: 107 fL — AB (ref 80–100)
Platelet: 118 10*3/uL — ABNORMAL LOW (ref 150–440)
RBC: 2.59 10*6/uL — ABNORMAL LOW (ref 3.80–5.20)
RDW: 14.4 % (ref 11.5–14.5)
WBC: 3.7 10*3/uL (ref 3.6–11.0)

## 2014-05-20 LAB — BASIC METABOLIC PANEL
ANION GAP: 8 (ref 7–16)
BUN: 56 mg/dL — ABNORMAL HIGH (ref 7–18)
CO2: 24 mmol/L (ref 21–32)
CREATININE: 1.75 mg/dL — AB (ref 0.60–1.30)
Calcium, Total: 7.9 mg/dL — ABNORMAL LOW (ref 8.5–10.1)
Chloride: 111 mmol/L — ABNORMAL HIGH (ref 98–107)
EGFR (African American): 31 — ABNORMAL LOW
EGFR (Non-African Amer.): 27 — ABNORMAL LOW
GLUCOSE: 93 mg/dL (ref 65–99)
Osmolality: 300 (ref 275–301)
Potassium: 4 mmol/L (ref 3.5–5.1)
SODIUM: 143 mmol/L (ref 136–145)

## 2014-05-20 LAB — TROPONIN I

## 2014-05-30 ENCOUNTER — Ambulatory Visit: Payer: Self-pay | Admitting: Oncology

## 2014-05-30 LAB — CANCER CENTER HEMOGLOBIN: HGB: 9.5 g/dL — ABNORMAL LOW (ref 12.0–16.0)

## 2014-06-02 ENCOUNTER — Ambulatory Visit: Payer: Self-pay | Admitting: Oncology

## 2014-07-26 ENCOUNTER — Emergency Department: Payer: Self-pay | Admitting: Emergency Medicine

## 2014-08-01 ENCOUNTER — Ambulatory Visit: Payer: Self-pay | Admitting: Oncology

## 2014-08-01 LAB — CBC CANCER CENTER
BASOS ABS: 0 x10 3/mm (ref 0.0–0.1)
BASOS PCT: 0.8 %
EOS ABS: 0 x10 3/mm (ref 0.0–0.7)
EOS PCT: 0.1 %
HCT: 29.3 % — ABNORMAL LOW (ref 35.0–47.0)
HGB: 9.6 g/dL — ABNORMAL LOW (ref 12.0–16.0)
LYMPHS ABS: 1.1 x10 3/mm (ref 1.0–3.6)
Lymphocyte %: 23.4 %
MCH: 34.2 pg — AB (ref 26.0–34.0)
MCHC: 32.7 g/dL (ref 32.0–36.0)
MCV: 105 fL — ABNORMAL HIGH (ref 80–100)
Monocyte #: 0.5 x10 3/mm (ref 0.2–0.9)
Monocyte %: 11.5 %
NEUTROS ABS: 3 x10 3/mm (ref 1.4–6.5)
NEUTROS PCT: 64.2 %
PLATELETS: 172 x10 3/mm (ref 150–440)
RBC: 2.8 10*6/uL — AB (ref 3.80–5.20)
RDW: 15.1 % — ABNORMAL HIGH (ref 11.5–14.5)
WBC: 4.7 x10 3/mm (ref 3.6–11.0)

## 2014-08-01 LAB — IRON AND TIBC
IRON: 103 ug/dL (ref 50–170)
Iron Bind.Cap.(Total): 222 ug/dL — ABNORMAL LOW (ref 250–450)
Iron Saturation: 46 %
Unbound Iron-Bind.Cap.: 119 ug/dL

## 2014-08-01 LAB — FERRITIN: Ferritin (ARMC): 213 ng/mL (ref 8–388)

## 2014-08-02 ENCOUNTER — Ambulatory Visit: Payer: Self-pay | Admitting: Oncology

## 2014-08-15 ENCOUNTER — Emergency Department: Payer: Self-pay | Admitting: Emergency Medicine

## 2014-10-03 ENCOUNTER — Ambulatory Visit: Payer: Self-pay | Admitting: Oncology

## 2014-10-03 LAB — CANCER CENTER HEMOGLOBIN: HGB: 9.2 g/dL — ABNORMAL LOW (ref 12.0–16.0)

## 2014-11-01 ENCOUNTER — Ambulatory Visit
Admit: 2014-11-01 | Disposition: A | Discharge status: Home / Self Care | Payer: Self-pay | Attending: Oncology | Admitting: Oncology

## 2014-11-23 ENCOUNTER — Emergency Department: Payer: Self-pay | Admitting: Emergency Medicine

## 2014-12-02 ENCOUNTER — Ambulatory Visit
Admit: 2014-12-02 | Disposition: A | Discharge status: Home / Self Care | Payer: Self-pay | Attending: Oncology | Admitting: Oncology

## 2014-12-25 NOTE — Discharge Summary (Signed)
PATIENT NAME:  Gabrielle Sanders, Gabrielle Sanders MR#:  [id number] DATE OF BIRTH:  02-May-1931  DATE OF ADMISSION:  08/26/2011 DATE OF DISCHARGE:  08/28/2011  DISCHARGE DIAGNOSES:  1. Acute on chronic diastolic failure. 2. Hypertension. 3. Anemia of chronic disease, now on Epogen. 4. Chronic obstructive pulmonary disease. 5. Hyperlipidemia.  6. Renal insufficiency, most likely chronic kidney disease, stage III. 7. Seizure disorder. 8. Depression. 9. Osteoarthritis. 10. Hypothyroidism. 11. History of cervical cancer.  12. Paroxysmal atrial fib.   CONSULTATION: Cardiology, Dr. Clayborn Bigness.   HOSPITAL COURSE: This is a 81 year old female who has history of chronic diastolic failure, chronic obstructive pulmonary disease, hypertension and hyperlipidemia. She presented with shortness of breath, also presented with dyspnea on exertion, weight gain. She said she gained about 7 pounds of weight overnight. She takes Lasix at home. She said her legs feel tight at the time of admission, so in the ER she had a BNP done and the BNP showed that she had a BNP of 10,510. Her creatinine when she came in was around 1.34. She had an echocardiogram done in the Emergency Room which showed that the patient had ejection fraction of greater than 55%, LV function normal. No thrombus, normal left ventricle wall thickness, right ventricular function is normal. Right ventricular pressure is elevated at 30 to 40 mm of mercury, large left pleural effusion. A chest x-ray was done and the chest x-ray actually showed that there is near-complete resolution of bilateral effusions and underlying component of mild pulmonary edema, some residual effusion in the right lower lobe. When she came in, her white count was 4.1, hemoglobin was 8.9. Her hemoglobin has been stable in the range of 8.5 to 8.9. It was 8.5 at the time of discharge. It is stable. The patient is following with Dr. Grayland Ormond at the [facility] receiving Epogen. She was started on IV  Lasix. Her creatinine went up to 1.62 on IV Lasix from 1.34. It has improved to 1.48. This needs to be monitored. Her swelling has significantly improved. No shortness of breath. Her chest is clear. I am going to give her 40 mg of p.o. Lasix twice a day for five days and then once daily. She is already on potassium. Her potassium is stable. She is also on Imdur, hydralazine for her congestive heart failure. If her creatinine remains stable, possibly she can be started on some ACE inhibitor as an outpatient. She was also bradycardic when she came in. Her EKG when she came in showed sinus bradycardia at the rate of 53, no acute ischemic changes, nonspecific T wave changes. Metoprolol was stopped and her heart rate actually has improved on stopping the metoprolol. We will keep her off the metoprolol right now.   DISCHARGE MEDICATIONS:  1. Trazodone 100 mg 2 tablets at bedtime. 2. Allopurinol 100 mg daily.  3. Isosorbide mononitrate 30 mg daily.  4. Protonix 40 mg daily.  5. Folic acid 1 mg daily.  6. Spiriva handihaler daily.  7. Levothyroxine 0.25 milligrams daily.  8. Potassium chloride 20 mEq daily.  9. Hydralazine 50 mg t.i.d.  10. Dilantin-125, 450 mg only. 11. Tylenol 650 mg every four hours as needed.  12. Aspirin 81 mg. 13. Zyrtec 10 mg. 14. Ferrous Sulfate 325 mg. 15. Zoloft 50 mg. 16. Combivent 2 to 4 puffs q.6 hours p.r.n.  17. MiraLAX every day. 18. Colace 100 mg. 19. Flovent 2 puffs every eight hours.  20. Magnesium 400 mg daily. 21. Oxygen. 22. Lovastatin 20 mg daily.  23.  Tylox as needed. 24. Take Lasix 40 mg p.o. b.i.d. for five days and 40 mg p.o. once daily. The patient is already on potassium for that.   NOTE: Do not take metoprolol.   DIET: Low sodium, low cholesterol diet.   CONDITION AT DISCHARGE: Comfortable. T-max 98, heart rate of 157, blood pressure 166/62 to 140/100, saturating 94% on room air. Chest is clear. Heart sounds are regular. Abdomen soft, nontender.  Lower extremity edema is significantly improved.   FOLLOWUP: The patient should follow up with Dr. Baltazar Apo early next week. Follow-up BMP at Dr. Serita Grit office. Also watch her hemoglobin. Follow-up with Dr. Clayborn Bigness in 1 to 2 weeks.   TIME SPENT ON DISCHARGE: 50 minutes.   ____________________________ Mena Pauls, MD ag:ap D: 08/28/2011 09:50:19 ET T: 08/29/2011 10:31:24 ET JOB#: [id number]  cc: Mena Pauls, MD, <Dictator> Dwayne D. Clayborn Bigness, MD [name], MD Mena Pauls MD ELECTRONICALLY SIGNED 09/20/2011 12:01

## 2014-12-25 NOTE — Consult Note (Signed)
PATIENT NAME:  Gabrielle Sanders, Gabrielle Sanders MR#:  [id number] DATE OF BIRTH:  10/09/1930  DATE OF CONSULTATION:  08/26/2011  REFERRING PHYSICIAN:  Dr. Roxine Caddy CONSULTING PHYSICIAN:  Britain Saber D. Clayborn Bigness, MD  PRIMARY CARE PHYSICIAN:  Veda Canning, MD  INDICATION: Shortness of breath, congestive heart failure, and edema.   HISTORY OF PRESENT ILLNESS: Gabrielle Sanders is a 81 year old female with multiple medical problems, past history of obesity,  possible obstructive sleep apnea, leukemia, MRSA,  peripheral vascular disease, hyperlipidemia, gastroesophageal reflux disease, anemia, coronary artery disease, hypothyroidism, arthritis, depression, anxiety, chronic obstructive pulmonary disease, myocardial infarction, hypertension, and congestive heart failure who was recently in the hospital and represented with recurrent shortness of breath, leg edema, and weight gain. She states she gained about 7 pounds overnight and had worsening leg swelling, shortness of breath, and abdominal distention. She denies any worsening fever. She has had some chest pain and weakness and fatigue so finally came to the Emergency Room for evaluation after the Home Health nurse came and noticed that she gained so much weight.   REVIEW OF SYSTEMS:   No blackout spells or syncope. No nausea or vomiting. No fever, no chills, no sweats. No weight loss. She has had weight gain. No hemoptysis or hematemesis. Denies bright red blood per rectum. No vision change or hearing change. Denies sputum production or cough.   PAST MEDICAL HISTORY: As mentioned. 1. Congestive heart failure. 2. Coronary artery disease. 3. Myocardial infarction. 4. Leukemia. 5. Methicillin-resistant Staphylococcus aureus. 6. Peripheral vascular disease.   7. Uterine cancer  8. Colon cancer.  9. Hyperlipidemia.  10. Gastroesophageal reflux disease.  11. Anemia.  12. Hypothyroidism.  13. Arthritis.  14. Depression.  15. Cerebrovascular accident.  16. Anxiety.   17. Chronic obstructive pulmonary disease. 18. Gout. 19. Hypertension.   PAST SURGICAL HISTORY:  1. Right hip. 2. Colon and stomach. 3. Gallbladder.  4. Tonsillectomy.  5. Appendectomy.  6. Cholecystectomy.  7. Hysterectomy.   FAMILY HISTORY: Hypertension, shortness of breath.   SOCIAL HISTORY: Retired. No recent smoking or alcohol consumption.   MEDICATIONS:  1. Zyrtec 10 a day.  2. Zoloft 50 a day.  3. Tylenol 650 q. 6 hours p.r.n.  4. Trazodone 100, 2 tablets a day.  5. Spiriva 18 mcg a day.  6. Protonix 40 mg a day.  7. Oxygen therapy chronically.  8. MiraLAX p.r.n.  9. Metoprolol once a day.  10. Succinate once a day.  11. Magnesium oxide 400 mg once a day.  12. Levothyroxine 25 mcg once a day.  13. Lasix 40 mg a day.  14. Imdur  30 a day.  15. Hydralazine 50 mg 3 times a day.  16. Folic acid 1 mg a day.  17. Flovent 2 puffs q. 8 hours. 18. Ferrous sulfate 225 mg daily.  19. Dilantin once a day.  20. Combivent 2 puffs q. 4 hours p.r.n.  21. Aspirin 81 mg a day.  22. Allopurinol 100 mg a day.   ALLERGIES: Pineapple, celery, latex, Xanax, prednisone, Vicodin, penicillin.   PHYSICAL EXAMINATION:  VITAL SIGNS: Blood pressure 171/60, pulse 51, respiratory rate 16, afebrile.   HEENT: Normocephalic, atraumatic. Pupils are equal, reactive to light.   NECK: Supple. Positive JVD. No bruits or adenopathy.   LUNGS: Bilateral rhonchi. Mild rales in the bases,  few expiratory wheezes.   HEART: Distant, but regular rate and rhythm. Systolic ejection murmur left sternal border. PMI nondisplaced.   ABDOMEN: Benign. Soft, nontender. No masses, rebound, guarding, or tenderness.  EXTREMITIES: 2 to 3+ edema. Positive pulses. No cyanosis.   NEUROLOGIC: Grossly intact.   SKIN: Normal.  LABORATORY, DIAGNOSTIC, AND RADIOLOGIC DATA: MET-B unremarkable. Creatinine 1.3. Liver function tests unremarkable. BUN 36, glucose 99, hemoglobin and hematocrit 8.9 and 26, white count  4, platelet count 117, BNP 10,000. Chest x-ray: Evidence of mild failure. Troponins, cardiac enzymes are negative.   ASSESSMENT:  1. Heart failure.  2. Shortness of breath.  3. Edema.  4. Obesity. 5. Hypertension. 6. Bradycardia. 7. Abnormal EKG.  8. Renal insufficiency.  9. Possible obstructive sleep apnea. 10. Weight gain.   PLAN: Recommend admit. Place on telemetry.  IV diuretics. Deep vein thrombosis prophylaxis. Watch for anemia. Follow up hemoglobin and hematocrit. Supplemental O2. Watch for renal insufficiency. Continue beta blockers but reduce the dose.  Rule out for myocardial infarction. Follow up daily weights and continue to treat the patient aggressively for heart failure symptoms.   ____________________________ Loran Senters Clayborn Bigness, MD [id number] D: 08/26/2011 14:28:27 ET T: 08/26/2011 15:50:11 ET JOB#: [id number]  cc: Clancy Mullarkey D. Clayborn Bigness, MD, <Dictator> Yolonda Kida MD ELECTRONICALLY SIGNED 09/12/2011 5:58

## 2015-01-23 ENCOUNTER — Other Ambulatory Visit: Payer: Self-pay | Admitting: Oncology

## 2015-01-23 DIAGNOSIS — D464 Refractory anemia, unspecified: Secondary | ICD-10-CM | POA: Insufficient documentation

## 2015-01-23 DIAGNOSIS — D649 Anemia, unspecified: Secondary | ICD-10-CM

## 2015-01-26 ENCOUNTER — Encounter: Payer: Self-pay | Admitting: Oncology

## 2015-01-26 ENCOUNTER — Inpatient Hospital Stay: Payer: Medicare Other

## 2015-01-26 ENCOUNTER — Inpatient Hospital Stay: Payer: Medicare Other | Attending: Oncology

## 2015-01-26 ENCOUNTER — Inpatient Hospital Stay (HOSPITAL_BASED_OUTPATIENT_CLINIC_OR_DEPARTMENT_OTHER): Payer: Medicare Other | Admitting: Oncology

## 2015-01-26 VITALS — BP 157/71

## 2015-01-26 VITALS — BP 166/77 | HR 65 | Temp 96.6°F | Resp 16

## 2015-01-26 DIAGNOSIS — R531 Weakness: Secondary | ICD-10-CM

## 2015-01-26 DIAGNOSIS — D649 Anemia, unspecified: Secondary | ICD-10-CM

## 2015-01-26 DIAGNOSIS — Z79899 Other long term (current) drug therapy: Secondary | ICD-10-CM

## 2015-01-26 DIAGNOSIS — R5383 Other fatigue: Secondary | ICD-10-CM | POA: Diagnosis not present

## 2015-01-26 DIAGNOSIS — F329 Major depressive disorder, single episode, unspecified: Secondary | ICD-10-CM

## 2015-01-26 DIAGNOSIS — I129 Hypertensive chronic kidney disease with stage 1 through stage 4 chronic kidney disease, or unspecified chronic kidney disease: Secondary | ICD-10-CM | POA: Diagnosis not present

## 2015-01-26 DIAGNOSIS — N189 Chronic kidney disease, unspecified: Secondary | ICD-10-CM | POA: Insufficient documentation

## 2015-01-26 DIAGNOSIS — D508 Other iron deficiency anemias: Secondary | ICD-10-CM

## 2015-01-26 DIAGNOSIS — E039 Hypothyroidism, unspecified: Secondary | ICD-10-CM

## 2015-01-26 DIAGNOSIS — M1990 Unspecified osteoarthritis, unspecified site: Secondary | ICD-10-CM

## 2015-01-26 DIAGNOSIS — E785 Hyperlipidemia, unspecified: Secondary | ICD-10-CM

## 2015-01-26 DIAGNOSIS — I10 Essential (primary) hypertension: Secondary | ICD-10-CM

## 2015-01-26 DIAGNOSIS — D631 Anemia in chronic kidney disease: Secondary | ICD-10-CM | POA: Insufficient documentation

## 2015-01-26 DIAGNOSIS — J449 Chronic obstructive pulmonary disease, unspecified: Secondary | ICD-10-CM

## 2015-01-26 DIAGNOSIS — R5381 Other malaise: Secondary | ICD-10-CM | POA: Diagnosis not present

## 2015-01-26 LAB — CBC WITH DIFFERENTIAL/PLATELET
BASOS ABS: 0 10*3/uL (ref 0–0.1)
Basophils Relative: 1 %
Eosinophils Absolute: 0 10*3/uL (ref 0–0.7)
Eosinophils Relative: 1 %
HCT: 30.9 % — ABNORMAL LOW (ref 35.0–47.0)
HEMOGLOBIN: 9.9 g/dL — AB (ref 12.0–16.0)
LYMPHS PCT: 33 %
Lymphs Abs: 1.1 10*3/uL (ref 1.0–3.6)
MCH: 32.9 pg (ref 26.0–34.0)
MCHC: 32 g/dL (ref 32.0–36.0)
MCV: 102.9 fL — ABNORMAL HIGH (ref 80.0–100.0)
MONO ABS: 0.4 10*3/uL (ref 0.2–0.9)
Monocytes Relative: 14 %
Neutro Abs: 1.7 10*3/uL (ref 1.4–6.5)
Neutrophils Relative %: 51 %
Platelets: 145 10*3/uL — ABNORMAL LOW (ref 150–440)
RBC: 3.01 MIL/uL — ABNORMAL LOW (ref 3.80–5.20)
RDW: 15.3 % — ABNORMAL HIGH (ref 11.5–14.5)
WBC: 3.3 10*3/uL — ABNORMAL LOW (ref 3.6–11.0)

## 2015-01-26 MED ORDER — EPOETIN ALFA 40000 UNIT/ML IJ SOLN
40000.0000 [IU] | Freq: Once | INTRAMUSCULAR | Status: AC
  Administered 2015-01-26: 40000 [IU] via SUBCUTANEOUS
  Filled 2015-01-26: qty 1

## 2015-02-13 NOTE — Progress Notes (Signed)
[place]  [phone] [phone]  ID: Gabrielle Sanders OB: 06-19-1931  MR#: [id number]  HTD#:[id number]  Patient Care Team: Zollie Scale, MD as PCP - General (Internal Medicine)  CHIEF COMPLAINT:  Chief Complaint  Patient presents with  . Follow-up    anemia    INTERVAL HISTORY: Patient returns to clinic today for further evaluation, laboratory work, and consideration of Procrit.  She continues to complain of persistent weakness and fatigue but otherwise feels well.  She has had no recent fevers or illnesses. She denies any neurologic complaints.  She has no chest pain.  She has a fair appetite and denies weight loss.  She denies any nausea, vomiting, constipation, or diarrhea.  She has no melena or hematochezia.  She has no urinary complaints.  Patient offers no further specific complaints today.  REVIEW OF SYSTEMS:   Review of Systems  Constitutional: Positive for malaise/fatigue.  Respiratory: Negative.   Cardiovascular: Negative.   Neurological: Positive for weakness.    As per HPI. Otherwise, a complete review of systems is negatve.  PAST MEDICAL HISTORY: Past Medical History  Diagnosis Date  . CHF (congestive heart failure)   . COPD (chronic obstructive pulmonary disease)   . Chronic renal insufficiency   . Hyperlipidemia   . Hypertension   . Hypothyroidism   . Seizures   . Depression   . Arthritis   . Gout     PAST SURGICAL HISTORY: Past Surgical History  Procedure Laterality Date  . Partial hip arthroplasty Right   . Cholecystectomy    . Appendectomy      FAMILY HISTORY: Reviewed and unchanged. No report of malignancy or chronic disease.     ADVANCED DIRECTIVES:    HEALTH MAINTENANCE: History  Substance Use Topics  . Smoking status: Not on file  . Smokeless tobacco: Not on file  . Alcohol Use: Not on file     Colonoscopy:  PAP:  Bone density:  Lipid panel:  Allergies  Allergen Reactions  .  Alprazolam Other (See Comments)    "makes me climb the wall"  . Hydrocodone-Acetaminophen Other (See Comments)    ' Drives me wild"  . Other Other (See Comments)    Pollen, grass, dust, lint, cold air, hot humid air, pine causes "stuffiness" in head  . Pineapple Nausea And Vomiting  . Prednisone Other (See Comments)    "Stresses me out"  . Latex Rash  . Penicillins Rash    Current Outpatient Prescriptions  Medication Sig Dispense Refill  . allopurinol (ZYLOPRIM) 100 MG tablet     . dicyclomine (BENTYL) 10 MG capsule Take 10 mg by mouth 4 (four) times daily -  before meals and at bedtime.    . diphenhydrAMINE (BENADRYL) 25 MG tablet Take 25 mg by mouth at bedtime as needed.    . ENDOCET 5-325 MG per tablet     . ergocalciferol (VITAMIN D2) 50000 UNITS capsule Take 50,000 Units by mouth every 30 (thirty) days.    . folic acid (FOLVITE) 1 MG tablet Take 1 mg by mouth daily.    Marland Kitchen levothyroxine (SYNTHROID, LEVOTHROID) 25 MCG tablet Take by mouth.    . pantoprazole (PROTONIX) 40 MG tablet      No current facility-administered medications for this visit.    OBJECTIVE: Filed Vitals:   01/26/15 1552  BP: 166/77  Pulse: 65  Temp: 96.6 F (35.9 C)  Resp: 16     There is no height or weight on file  to calculate BMI.    ECOG FS:2 - Symptomatic, <50% confined to bed  General: Well-developed, well-nourished, no acute distress. Eyes: anicteric sclera. Lungs: Clear to auscultation bilaterally. Heart: Regular rate and rhythm. No rubs, murmurs, or gallops. Abdomen: Soft, nontender, nondistended. No organomegaly noted, normoactive bowel sounds. Musculoskeletal: No edema, cyanosis, or clubbing. Neuro: Alert, answering all questions appropriately. Cranial nerves grossly intact. Skin: No rashes or petechiae noted. Psych: Normal affect.   LAB RESULTS:  Lab Results  Component Value Date   NA 143 05/20/2014   K 4.0 05/20/2014   CL 111* 05/20/2014   CO2 24 05/20/2014   GLUCOSE 93  05/20/2014   BUN 56* 05/20/2014   CREATININE 1.75* 05/20/2014   CALCIUM 7.9* 05/20/2014   PROT 5.9* 06/23/2012   ALBUMIN 3.5 06/23/2012   AST 29 06/23/2012   ALT 18 06/23/2012   ALKPHOS 111 06/23/2012   BILITOT 0.3 03/27/2009   GFRNONAA 27* 05/20/2014   GFRAA 31* 05/20/2014    Lab Results  Component Value Date   WBC 3.3* 01/26/2015   NEUTROABS 1.7 01/26/2015   HGB 9.9* 01/26/2015   HCT 30.9* 01/26/2015   MCV 102.9* 01/26/2015   PLT 145* 01/26/2015     STUDIES: No results found.  ASSESSMENT: Anemia of chronic disease. (CHF, COPD, chronic renal insufficiency)  PLAN:    1.  Anemia: All of the patient's laboratory work previously was within normal limits, however her erythropoietin levels were inappropriately normal. Her last iron stores from August 01, 2014 were within normal limits. Patient's hemoglobin is less than 10.0, therefore she will receive Procrit today.  It appears that she only requires Procrit every 2 months. Return to clinic in 2 months and 4 months for consideration of Procrit and then in 6 months with repeat laboratory work and further evaluation. Patient understands she can return to clinic at any time if she has any questions, concerns, or complaints. 2.  Questionable leukemia: Patient stated previously that she has a history of "leukemia" that it is in "remission".  We do not have any other mention of this or documentation of this in her records.  Will consider flow cytometry in the future if clinically applicable.  Patient expressed understanding and was in agreement with this plan. She also understands that She can call clinic at any time with any questions, concerns, or complaints.    Lloyd Huger, MD   02/13/2015 8:51 AM

## 2015-03-23 ENCOUNTER — Inpatient Hospital Stay: Payer: Medicare Other | Attending: Oncology

## 2015-03-23 ENCOUNTER — Inpatient Hospital Stay: Payer: Medicare Other

## 2015-03-23 DIAGNOSIS — D508 Other iron deficiency anemias: Secondary | ICD-10-CM

## 2015-03-23 DIAGNOSIS — D649 Anemia, unspecified: Secondary | ICD-10-CM | POA: Diagnosis not present

## 2015-03-23 LAB — HEMOGLOBIN: HEMOGLOBIN: 10.4 g/dL — AB (ref 12.0–16.0)

## 2015-03-23 LAB — IRON AND TIBC
Iron: 85 ug/dL (ref 28–170)
Saturation Ratios: 39 % — ABNORMAL HIGH (ref 10.4–31.8)
TIBC: 221 ug/dL — ABNORMAL LOW (ref 250–450)
UIBC: 136 ug/dL

## 2015-03-23 LAB — FERRITIN: FERRITIN: 203 ng/mL (ref 11–307)

## 2015-03-28 LAB — LIPASE, BLOOD: LIPASE: 32 U/L

## 2015-03-28 LAB — URINALYSIS, COMPLETE
BACTERIA: NONE SEEN
BILIRUBIN, UR: NEGATIVE
Blood: NEGATIVE
Glucose,UR: NEGATIVE mg/dL (ref 0–75)
KETONE: NEGATIVE
Leukocyte Esterase: NEGATIVE
Nitrite: NEGATIVE
PROTEIN: NEGATIVE
Ph: 5 (ref 4.5–8.0)
Specific Gravity: 1.008 (ref 1.003–1.030)

## 2015-03-28 LAB — COMPREHENSIVE METABOLIC PANEL
ALBUMIN: 4.4 g/dL
ALK PHOS: 125 U/L
ANION GAP: 8 (ref 7–16)
BUN: 52 mg/dL — ABNORMAL HIGH
Bilirubin,Total: 0.4 mg/dL
CO2: 25 mmol/L
Calcium, Total: 9 mg/dL
Chloride: 108 mmol/L
Creatinine: 1.44 mg/dL — ABNORMAL HIGH
GFR CALC AF AMER: 39 — AB
GFR CALC NON AF AMER: 34 — AB
Glucose: 115 mg/dL — ABNORMAL HIGH
Potassium: 3.9 mmol/L
SGOT(AST): 20 U/L
SGPT (ALT): 12 U/L — ABNORMAL LOW
Sodium: 141 mmol/L
TOTAL PROTEIN: 7 g/dL

## 2015-03-28 LAB — CBC
HCT: 30.6 % — ABNORMAL LOW (ref 35.0–47.0)
HGB: 9.7 g/dL — ABNORMAL LOW (ref 12.0–16.0)
MCH: 32.2 pg (ref 26.0–34.0)
MCHC: 31.6 g/dL — AB (ref 32.0–36.0)
MCV: 102 fL — ABNORMAL HIGH (ref 80–100)
Platelet: 134 10*3/uL — ABNORMAL LOW (ref 150–440)
RBC: 3 10*6/uL — ABNORMAL LOW (ref 3.80–5.20)
RDW: 15.3 % — ABNORMAL HIGH (ref 11.5–14.5)
WBC: 4.5 10*3/uL (ref 3.6–11.0)

## 2015-05-25 ENCOUNTER — Inpatient Hospital Stay: Payer: Medicare Other

## 2015-05-25 ENCOUNTER — Inpatient Hospital Stay: Payer: Medicare Other | Attending: Oncology

## 2015-05-25 DIAGNOSIS — D649 Anemia, unspecified: Secondary | ICD-10-CM | POA: Diagnosis present

## 2015-05-25 LAB — HEMOGLOBIN: Hemoglobin: 10.7 g/dL — ABNORMAL LOW (ref 12.0–16.0)

## 2015-07-20 ENCOUNTER — Ambulatory Visit: Payer: Medicare Other

## 2015-07-20 ENCOUNTER — Ambulatory Visit: Payer: Medicare Other | Admitting: Oncology

## 2015-07-20 ENCOUNTER — Other Ambulatory Visit: Payer: Medicare Other

## 2015-07-24 ENCOUNTER — Inpatient Hospital Stay: Payer: Medicare Other | Attending: Oncology

## 2015-07-24 ENCOUNTER — Inpatient Hospital Stay: Payer: Medicare Other

## 2015-07-24 DIAGNOSIS — D649 Anemia, unspecified: Secondary | ICD-10-CM

## 2015-07-24 LAB — HEMOGLOBIN: HEMOGLOBIN: 10.6 g/dL — AB (ref 12.0–16.0)

## 2015-09-25 ENCOUNTER — Inpatient Hospital Stay: Payer: Medicare Other

## 2015-09-25 ENCOUNTER — Inpatient Hospital Stay: Payer: Medicare Other | Attending: Oncology

## 2015-09-25 ENCOUNTER — Inpatient Hospital Stay (HOSPITAL_BASED_OUTPATIENT_CLINIC_OR_DEPARTMENT_OTHER): Payer: Medicare Other | Admitting: Oncology

## 2015-09-25 VITALS — BP 153/72 | HR 67 | Temp 98.6°F | Resp 18 | Wt 210.1 lb

## 2015-09-25 DIAGNOSIS — R5383 Other fatigue: Secondary | ICD-10-CM

## 2015-09-25 DIAGNOSIS — R5381 Other malaise: Secondary | ICD-10-CM | POA: Diagnosis not present

## 2015-09-25 DIAGNOSIS — I129 Hypertensive chronic kidney disease with stage 1 through stage 4 chronic kidney disease, or unspecified chronic kidney disease: Secondary | ICD-10-CM | POA: Insufficient documentation

## 2015-09-25 DIAGNOSIS — D649 Anemia, unspecified: Secondary | ICD-10-CM

## 2015-09-25 DIAGNOSIS — M1990 Unspecified osteoarthritis, unspecified site: Secondary | ICD-10-CM | POA: Insufficient documentation

## 2015-09-25 DIAGNOSIS — R531 Weakness: Secondary | ICD-10-CM | POA: Insufficient documentation

## 2015-09-25 DIAGNOSIS — E039 Hypothyroidism, unspecified: Secondary | ICD-10-CM | POA: Insufficient documentation

## 2015-09-25 DIAGNOSIS — N189 Chronic kidney disease, unspecified: Secondary | ICD-10-CM | POA: Insufficient documentation

## 2015-09-25 DIAGNOSIS — Z79899 Other long term (current) drug therapy: Secondary | ICD-10-CM | POA: Insufficient documentation

## 2015-09-25 DIAGNOSIS — I509 Heart failure, unspecified: Secondary | ICD-10-CM | POA: Insufficient documentation

## 2015-09-25 DIAGNOSIS — D509 Iron deficiency anemia, unspecified: Secondary | ICD-10-CM | POA: Diagnosis not present

## 2015-09-25 DIAGNOSIS — E785 Hyperlipidemia, unspecified: Secondary | ICD-10-CM | POA: Insufficient documentation

## 2015-09-25 DIAGNOSIS — D631 Anemia in chronic kidney disease: Secondary | ICD-10-CM

## 2015-09-25 DIAGNOSIS — J449 Chronic obstructive pulmonary disease, unspecified: Secondary | ICD-10-CM

## 2015-09-25 DIAGNOSIS — M109 Gout, unspecified: Secondary | ICD-10-CM | POA: Diagnosis not present

## 2015-09-25 LAB — HEMOGLOBIN: Hemoglobin: 10.4 g/dL — ABNORMAL LOW (ref 12.0–16.0)

## 2015-09-25 NOTE — Progress Notes (Signed)
[place]  Telephone:(336) [phone] [phone]) [phone]  ID: Gabrielle Sanders OB: 21-Apr-1931  MR#: [id number]  [id number]  Patient Care Team: Zollie Scale, MD as PCP - General (Internal Medicine)  CHIEF COMPLAINT:  Chief Complaint  Patient presents with  . Iron deficiency anemia    INTERVAL HISTORY: Patient returns to clinic today for further evaluation, laboratory work, and consideration of Procrit.  She continues to complain of persistent weakness and fatigue but otherwise feels well.  She has had no recent fevers or illnesses. She denies any neurologic complaints.  She has no chest pain.  She has a fair appetite and denies weight loss.  She denies any nausea, vomiting, constipation, or diarrhea.  She has no melena or hematochezia.  She has no urinary complaints.  Patient offers no further specific complaints today.  REVIEW OF SYSTEMS:   Review of Systems  Constitutional: Positive for malaise/fatigue.  Respiratory: Negative.   Cardiovascular: Negative.   Neurological: Positive for weakness.    As per HPI. Otherwise, a complete review of systems is negatve.  PAST MEDICAL HISTORY: Past Medical History  Diagnosis Date  . CHF (congestive heart failure)   . COPD (chronic obstructive pulmonary disease)   . Chronic renal insufficiency   . Hyperlipidemia   . Hypertension   . Hypothyroidism   . Seizures   . Depression   . Arthritis   . Gout     PAST SURGICAL HISTORY: Past Surgical History  Procedure Laterality Date  . Partial hip arthroplasty Right   . Cholecystectomy    . Appendectomy      FAMILY HISTORY: Reviewed and unchanged. No report of malignancy or chronic disease.     ADVANCED DIRECTIVES:    HEALTH MAINTENANCE: Social History  Substance Use Topics  . Smoking status: Not on file  . Smokeless tobacco: Not on file  . Alcohol Use: Not on file     Colonoscopy:  PAP:  Bone density:  Lipid panel:  Allergies  Allergen  Reactions  . Alprazolam Other (See Comments)    "makes me climb the wall"  . Hydrocodone-Acetaminophen Other (See Comments)    ' Drives me wild"  . Other Other (See Comments)    Pollen, grass, dust, lint, cold air, hot humid air, pine causes "stuffiness" in head  . Pineapple Nausea And Vomiting  . Prednisone Other (See Comments)    "Stresses me out"  . Latex Rash  . Penicillins Rash    Current Outpatient Prescriptions  Medication Sig Dispense Refill  . allopurinol (ZYLOPRIM) 100 MG tablet     . dicyclomine (BENTYL) 10 MG capsule Take 10 mg by mouth 4 (four) times daily -  before meals and at bedtime.    . diphenhydrAMINE (BENADRYL) 25 MG tablet Take 25 mg by mouth at bedtime as needed.    . ENDOCET 5-325 MG per tablet     . ergocalciferol (VITAMIN D2) 50000 UNITS capsule Take 50,000 Units by mouth every 30 (thirty) days.    . folic acid (FOLVITE) 1 MG tablet Take 1 mg by mouth daily.    Marland Kitchen levothyroxine (SYNTHROID, LEVOTHROID) 25 MCG tablet Take by mouth.    . pantoprazole (PROTONIX) 40 MG tablet      No current facility-administered medications for this visit.    OBJECTIVE: Filed Vitals:   09/25/15 1010  BP: 153/72  Pulse: 67  Temp: 98.6 F (37 C)  Resp: 18     There is no height on file to calculate BMI.  ECOG FS:2 - Symptomatic, <50% confined to bed  General: Well-developed, well-nourished, no acute distress. Eyes: anicteric sclera. Lungs: Clear to auscultation bilaterally. Heart: Regular rate and rhythm. No rubs, murmurs, or gallops. Abdomen: Soft, nontender, nondistended. No organomegaly noted, normoactive bowel sounds. Musculoskeletal: No edema, cyanosis, or clubbing. Neuro: Alert, answering all questions appropriately. Cranial nerves grossly intact. Skin: No rashes or petechiae noted. Psych: Normal affect.   LAB RESULTS:  Lab Results  Component Value Date   NA 141 11/23/2014   K 3.9 11/23/2014   CL 108 11/23/2014   CO2 25 11/23/2014   GLUCOSE 115*  11/23/2014   BUN 52* 11/23/2014   CREATININE 1.44* 11/23/2014   CALCIUM 9.0 11/23/2014   PROT 7.0 11/23/2014   ALBUMIN 4.4 11/23/2014   AST 20 11/23/2014   ALT 12* 11/23/2014   ALKPHOS 125 11/23/2014   BILITOT 0.4 11/23/2014   GFRNONAA 34* 11/23/2014   GFRAA 39* 11/23/2014    Lab Results  Component Value Date   WBC 3.3* 01/26/2015   NEUTROABS 1.7 01/26/2015   HGB 10.4* 09/25/2015   HCT 30.9* 01/26/2015   MCV 102.9* 01/26/2015   PLT 145* 01/26/2015     STUDIES: No results found.  ASSESSMENT: Anemia of chronic disease. (CHF, COPD, chronic renal insufficiency)  PLAN:    1.  Anemia: All of the patient's laboratory work previously was within normal limits, however her erythropoietin levels were inappropriately normal. Her last iron stores from July 2016 were within normal limits. Patient's hemoglobin is greater than 10.0, therefore she will not receive Procrit today. She has not needed procrit for the last 6 months therefore will return to clinic in 3 months consideration of Procrit and then in 6 months with repeat laboratory work and further evaluation. Patient understands she can return to clinic at any time if she has any questions, concerns, or complaints. 2.  Questionable leukemia: Patient stated previously that she has a history of "leukemia" that it is in "remission".  We do not have any other mention of this or documentation of this in her records.  Will consider flow cytometry in the future if clinically applicable.  Patient expressed understanding and was in agreement with this plan. She also understands that She can call clinic at any time with any questions, concerns, or complaints.    Mayra Reel, NP   09/25/2015 1:05 PM   Patient was seen and evaluated independently and I agree with the assessment and plan as dictated above.  Lloyd Huger, MD 09/27/2015 6:16 AM

## 2015-09-25 NOTE — Progress Notes (Signed)
Patient offers no complaints today. 

## 2015-12-25 ENCOUNTER — Inpatient Hospital Stay: Payer: Medicare Other

## 2015-12-25 ENCOUNTER — Inpatient Hospital Stay: Payer: Medicare Other | Attending: Oncology

## 2016-03-25 ENCOUNTER — Inpatient Hospital Stay: Payer: Medicare Other | Attending: Oncology | Admitting: Oncology

## 2016-03-25 ENCOUNTER — Inpatient Hospital Stay: Payer: Medicare Other

## 2016-03-25 VITALS — BP 144/77 | HR 64 | Temp 99.0°F | Resp 18

## 2016-03-25 DIAGNOSIS — R5381 Other malaise: Secondary | ICD-10-CM | POA: Insufficient documentation

## 2016-03-25 DIAGNOSIS — R5383 Other fatigue: Secondary | ICD-10-CM | POA: Diagnosis not present

## 2016-03-25 DIAGNOSIS — E039 Hypothyroidism, unspecified: Secondary | ICD-10-CM | POA: Insufficient documentation

## 2016-03-25 DIAGNOSIS — I509 Heart failure, unspecified: Secondary | ICD-10-CM | POA: Insufficient documentation

## 2016-03-25 DIAGNOSIS — J449 Chronic obstructive pulmonary disease, unspecified: Secondary | ICD-10-CM | POA: Insufficient documentation

## 2016-03-25 DIAGNOSIS — R531 Weakness: Secondary | ICD-10-CM | POA: Diagnosis not present

## 2016-03-25 DIAGNOSIS — D649 Anemia, unspecified: Secondary | ICD-10-CM

## 2016-03-25 DIAGNOSIS — N189 Chronic kidney disease, unspecified: Secondary | ICD-10-CM

## 2016-03-25 DIAGNOSIS — Z79899 Other long term (current) drug therapy: Secondary | ICD-10-CM

## 2016-03-25 DIAGNOSIS — I130 Hypertensive heart and chronic kidney disease with heart failure and stage 1 through stage 4 chronic kidney disease, or unspecified chronic kidney disease: Secondary | ICD-10-CM | POA: Diagnosis present

## 2016-03-25 DIAGNOSIS — E785 Hyperlipidemia, unspecified: Secondary | ICD-10-CM | POA: Insufficient documentation

## 2016-03-25 DIAGNOSIS — D508 Other iron deficiency anemias: Secondary | ICD-10-CM

## 2016-03-25 DIAGNOSIS — D631 Anemia in chronic kidney disease: Secondary | ICD-10-CM

## 2016-03-25 LAB — CBC WITH DIFFERENTIAL/PLATELET
BASOS ABS: 0.1 10*3/uL (ref 0–0.1)
BASOS PCT: 1 %
Eosinophils Absolute: 0.1 10*3/uL (ref 0–0.7)
Eosinophils Relative: 3 %
HEMATOCRIT: 30.5 % — AB (ref 35.0–47.0)
HEMOGLOBIN: 9.8 g/dL — AB (ref 12.0–16.0)
Lymphocytes Relative: 29 %
Lymphs Abs: 1.1 10*3/uL (ref 1.0–3.6)
MCH: 33.4 pg (ref 26.0–34.0)
MCHC: 32.2 g/dL (ref 32.0–36.0)
MCV: 103.8 fL — ABNORMAL HIGH (ref 80.0–100.0)
Monocytes Absolute: 0.4 10*3/uL (ref 0.2–0.9)
Monocytes Relative: 11 %
NEUTROS ABS: 2 10*3/uL (ref 1.4–6.5)
NEUTROS PCT: 56 %
Platelets: 144 10*3/uL — ABNORMAL LOW (ref 150–440)
RBC: 2.93 MIL/uL — ABNORMAL LOW (ref 3.80–5.20)
RDW: 15.3 % — AB (ref 11.5–14.5)
WBC: 3.6 10*3/uL (ref 3.6–11.0)

## 2016-03-25 LAB — IRON AND TIBC
IRON: 89 ug/dL (ref 28–170)
Saturation Ratios: 45 % — ABNORMAL HIGH (ref 10.4–31.8)
TIBC: 196 ug/dL — AB (ref 250–450)
UIBC: 107 ug/dL

## 2016-03-25 LAB — FERRITIN: FERRITIN: 238 ng/mL (ref 11–307)

## 2016-03-25 MED ORDER — EPOETIN ALFA 40000 UNIT/ML IJ SOLN
40000.0000 [IU] | Freq: Once | INTRAMUSCULAR | Status: AC
  Administered 2016-03-25: 40000 [IU] via SUBCUTANEOUS
  Filled 2016-03-25: qty 1

## 2016-03-25 NOTE — Progress Notes (Signed)
States is feeling weak and tired today.

## 2016-03-27 NOTE — Progress Notes (Signed)
[place]  Telephone:(336) [phone] [phone]) [phone]  ID: Gabrielle Sanders OB: [birth year]  MR#: [id number]  ERD#:[id number]  Patient Care Team: Zollie Scale, MD as PCP - General (Internal Medicine)  CHIEF COMPLAINT:  Chief Complaint  Patient presents with  . Anemia    INTERVAL HISTORY: Patient returns to clinic today for further evaluation, laboratory work, and consideration of Procrit.  She continues to complain of persistent weakness and fatigue but otherwise feels well.  She has had no recent fevers or illnesses. She denies any neurologic complaints.  She has no chest pain.  She has a fair appetite and denies weight loss.  She denies any nausea, vomiting, constipation, or diarrhea.  She has no melena or hematochezia.  She has no urinary complaints.  Patient offers no further specific complaints today.  REVIEW OF SYSTEMS:   Review of Systems  Constitutional: Positive for malaise/fatigue. Negative for fever and weight loss.  Respiratory: Negative.  Negative for shortness of breath.   Cardiovascular: Negative.  Negative for chest pain.  Gastrointestinal: Negative.  Negative for abdominal pain, blood in stool and melena.  Genitourinary: Negative.   Musculoskeletal: Negative.   Neurological: Positive for weakness.  Psychiatric/Behavioral: Negative.     As per HPI. Otherwise, a complete review of systems is negatve.  PAST MEDICAL HISTORY: Past Medical History:  Diagnosis Date  . Arthritis   . CHF (congestive heart failure)   . Chronic renal insufficiency   . COPD (chronic obstructive pulmonary disease)   . Depression   . Gout   . Hyperlipidemia   . Hypertension   . Hypothyroidism   . Seizures     PAST SURGICAL HISTORY: Past Surgical History:  Procedure Laterality Date  . APPENDECTOMY    . CHOLECYSTECTOMY    . PARTIAL HIP ARTHROPLASTY Right     FAMILY HISTORY: Reviewed and unchanged. No report of malignancy or chronic  disease.     ADVANCED DIRECTIVES:    HEALTH MAINTENANCE: Social History  Substance Use Topics  . Smoking status: Not on file  . Smokeless tobacco: Not on file  . Alcohol use Not on file     Colonoscopy:  PAP:  Bone density:  Lipid panel:  Allergies  Allergen Reactions  . Alprazolam Other (See Comments)    "makes me climb the wall"  . Hydrocodone-Acetaminophen Other (See Comments)    ' Drives me wild"  . Other Other (See Comments)    Pollen, grass, dust, lint, cold air, hot humid air, pine causes "stuffiness" in head  . Pineapple Nausea And Vomiting  . Prednisone Other (See Comments)    "Stresses me out"  . Latex Rash  . Penicillins Rash    Current Outpatient Prescriptions  Medication Sig Dispense Refill  . allopurinol (ZYLOPRIM) 100 MG tablet     . dicyclomine (BENTYL) 10 MG capsule Take 10 mg by mouth 4 (four) times daily -  before meals and at bedtime.    . diphenhydrAMINE (BENADRYL) 25 MG tablet Take 25 mg by mouth at bedtime as needed.    . ENDOCET 5-325 MG per tablet     . ergocalciferol (VITAMIN D2) 50000 UNITS capsule Take 50,000 Units by mouth every 30 (thirty) days.    . folic acid (FOLVITE) 1 MG tablet Take 1 mg by mouth daily.    Marland Kitchen levothyroxine (SYNTHROID, LEVOTHROID) 25 MCG tablet Take by mouth.    . pantoprazole (PROTONIX) 40 MG tablet      No current facility-administered medications for this  visit.     OBJECTIVE: Vitals:   03/25/16 1501  BP: (!) 144/77  Pulse: 64  Resp: 18  Temp: 99 F (37.2 C)     There is no height or weight on file to calculate BMI.    ECOG FS:2 - Symptomatic, <50% confined to bed  General: Well-developed, well-nourished, no acute distress. Eyes: anicteric sclera. Lungs: Clear to auscultation bilaterally. Heart: Regular rate and rhythm. No rubs, murmurs, or gallops. Abdomen: Soft, nontender, nondistended. No organomegaly noted, normoactive bowel sounds. Musculoskeletal: No edema, cyanosis, or clubbing. Neuro: Alert,  answering all questions appropriately. Cranial nerves grossly intact. Skin: No rashes or petechiae noted. Psych: Normal affect.   LAB RESULTS:  Lab Results  Component Value Date   NA 141 11/23/2014   K 3.9 11/23/2014   CL 108 11/23/2014   CO2 25 11/23/2014   GLUCOSE 115 (H) 11/23/2014   BUN 52 (H) 11/23/2014   CREATININE 1.44 (H) 11/23/2014   CALCIUM 9.0 11/23/2014   PROT 7.0 11/23/2014   ALBUMIN 4.4 11/23/2014   AST 20 11/23/2014   ALT 12 (L) 11/23/2014   ALKPHOS 125 11/23/2014   BILITOT 0.4 11/23/2014   GFRNONAA 34 (L) 11/23/2014   GFRAA 39 (L) 11/23/2014    Lab Results  Component Value Date   WBC 3.6 03/25/2016   NEUTROABS 2.0 03/25/2016   HGB 9.8 (L) 03/25/2016   HCT 30.5 (L) 03/25/2016   MCV 103.8 (H) 03/25/2016   PLT 144 (L) 03/25/2016     STUDIES: No results found.  ASSESSMENT: Anemia of chronic disease. (CHF, COPD, chronic renal insufficiency)  PLAN:    1.  Anemia: All of the patient's laboratory work previously was within normal limits, however her erythropoietin levels were inappropriately normal. Her last iron stores from July 2016 were within normal limits. It is possible patient has underlying MDS, but this would take a bone marrow biopsy to diagnose this is not necessary at this time. Patient's hemoglobin is less than 10.0, therefore will proceed with 40,000 units subcutaneous Procrit today. Patient appears to only require treatment probably every 6 months. Return to clinic in 6 months with repeat laboratory work and further evaluation.  2.  Questionable leukemia: Patient stated previously that she has a history of "leukemia" that it is in "remission".  We do not have any other mention of this or documentation of this in her records.  Will consider flow cytometry in the future if clinically applicable.  Patient expressed understanding and was in agreement with this plan. She also understands that She can call clinic at any time with any questions,  concerns, or complaints.    Lloyd Huger, MD   03/27/2016 12:27 AM

## 2016-09-24 NOTE — Progress Notes (Signed)
[place]  [phone]) [phone] Fax:(336) [phone]  ID: Gabrielle Sanders OB: [birth year]  MR#: [id number]  RFF#:[id number]  Patient Care Team: Zollie Scale, MD as PCP - General (Internal Medicine)  CHIEF COMPLAINT: Refractory anemia, unspecified.   INTERVAL HISTORY: Patient returns to clinic today for further evaluation, laboratory work, and consideration of Procrit.  She continues to complain of persistent weakness and fatigue but otherwise feels well.  She has had no recent fevers or illnesses. She denies any neurologic complaints.  She has no chest pain.  She has a fair appetite and denies weight loss.  She denies any nausea, vomiting, constipation, or diarrhea.  She has no melena or hematochezia.  She has no urinary complaints.  Patient offers no further specific complaints today.  REVIEW OF SYSTEMS:   Review of Systems  Constitutional: Positive for malaise/fatigue. Negative for fever and weight loss.  Respiratory: Negative.  Negative for cough and shortness of breath.   Cardiovascular: Negative.  Negative for chest pain and leg swelling.  Gastrointestinal: Negative.  Negative for abdominal pain, blood in stool and melena.  Genitourinary: Negative.   Musculoskeletal: Negative.   Neurological: Positive for weakness.  Psychiatric/Behavioral: Negative.  The patient is not nervous/anxious.     As per HPI. Otherwise, a complete review of systems is negative.  PAST MEDICAL HISTORY: Past Medical History:  Diagnosis Date  . Arthritis   . CHF (congestive heart failure)   . Chronic renal insufficiency   . COPD (chronic obstructive pulmonary disease)   . Depression   . Gout   . Hyperlipidemia   . Hypertension   . Hypothyroidism   . Seizures     PAST SURGICAL HISTORY: Past Surgical History:  Procedure Laterality Date  . APPENDECTOMY    . CHOLECYSTECTOMY    . PARTIAL HIP ARTHROPLASTY Right     FAMILY HISTORY: Reviewed and unchanged. No report of  malignancy or chronic disease.     ADVANCED DIRECTIVES:    HEALTH MAINTENANCE: Social History  Substance Use Topics  . Smoking status: Not on file  . Smokeless tobacco: Not on file  . Alcohol use Not on file     Colonoscopy:  PAP:  Bone density:  Lipid panel:  Allergies  Allergen Reactions  . Alprazolam Other (See Comments)    "makes me climb the wall"  . Hydrocodone-Acetaminophen Other (See Comments)    ' Drives me wild"  . Other Other (See Comments)    Pollen, grass, dust, lint, cold air, hot humid air, pine causes "stuffiness" in head  . Pineapple Nausea And Vomiting  . Prednisone Other (See Comments)    "Stresses me out"  . Latex Rash  . Penicillins Rash    Current Outpatient Prescriptions  Medication Sig Dispense Refill  . allopurinol (ZYLOPRIM) 100 MG tablet Take 100 mg by mouth daily.     Marland Kitchen dicyclomine (BENTYL) 10 MG capsule Take 10 mg by mouth 4 (four) times daily -  before meals and at bedtime.    . diphenhydrAMINE (BENADRYL) 25 MG tablet Take 25 mg by mouth at bedtime as needed.    . doxepin (SINEQUAN) 50 MG capsule Take 50 mg by mouth daily.    . ENDOCET 5-325 MG per tablet     . ergocalciferol (VITAMIN D2) 50000 UNITS capsule Take 50,000 Units by mouth every 30 (thirty) days.    . folic acid (FOLVITE) 1 MG tablet Take 1 mg by mouth daily.    . furosemide (LASIX) 40 MG tablet  Take 40 mg by mouth 2 (two) times daily.    . hydrALAZINE (APRESOLINE) 100 MG tablet Take 100 mg by mouth daily.    . isosorbide mononitrate (IMDUR) 30 MG 24 hr tablet Take 30 mg by mouth daily.     Marland Kitchen levothyroxine (SYNTHROID, LEVOTHROID) 25 MCG tablet Take 25 mcg by mouth daily.     Marland Kitchen lovastatin (MEVACOR) 20 MG tablet Take 20 mg by mouth daily.    . pantoprazole (PROTONIX) 40 MG tablet Take 40 mg by mouth daily.     . phenytoin (DILANTIN) 100 MG ER capsule Take 100 mg by mouth daily.     . polyethylene glycol powder (GLYCOLAX/MIRALAX) powder     . sertraline (ZOLOFT) 100 MG tablet  Take 100 mg by mouth daily.     . traZODone (DESYREL) 100 MG tablet as needed for Sleep.     No current facility-administered medications for this visit.     OBJECTIVE: Vitals:   09/25/16 1453  BP: 133/76  Pulse: 78  Resp: 18  Temp: 98.3 F (36.8 C)     There is no height or weight on file to calculate BMI.    ECOG FS:2 - Symptomatic, <50% confined to bed  General: Well-developed, well-nourished, no acute distress. Eyes: anicteric sclera. Lungs: Clear to auscultation bilaterally. Heart: Regular rate and rhythm. No rubs, murmurs, or gallops. Abdomen: Soft, nontender, nondistended. No organomegaly noted, normoactive bowel sounds. Musculoskeletal: No edema, cyanosis, or clubbing. Neuro: Alert, answering all questions appropriately. Cranial nerves grossly intact. Skin: No rashes or petechiae noted. Psych: Normal affect.   LAB RESULTS:  Lab Results  Component Value Date   NA 141 11/23/2014   K 3.9 11/23/2014   CL 108 11/23/2014   CO2 25 11/23/2014   GLUCOSE 115 (H) 11/23/2014   BUN 52 (H) 11/23/2014   CREATININE 1.44 (H) 11/23/2014   CALCIUM 9.0 11/23/2014   PROT 7.0 11/23/2014   ALBUMIN 4.4 11/23/2014   AST 20 11/23/2014   ALT 12 (L) 11/23/2014   ALKPHOS 125 11/23/2014   BILITOT 0.4 11/23/2014   GFRNONAA 34 (L) 11/23/2014   GFRAA 39 (L) 11/23/2014    Lab Results  Component Value Date   WBC 4.2 09/25/2016   NEUTROABS 2.4 09/25/2016   HGB 10.3 (L) 09/25/2016   HCT 30.8 (L) 09/25/2016   MCV 101.9 (H) 09/25/2016   PLT 170 09/25/2016   Lab Results  Component Value Date   IRON 84 09/25/2016   TIBC 215 (L) 09/25/2016   IRONPCTSAT 39 (H) 09/25/2016   Lab Results  Component Value Date   FERRITIN 210 09/25/2016     STUDIES: No results found.  ASSESSMENT: Refractory anemia, unspecified.   PLAN:    1.  Refractory anemia, unspecified: All of the patient's laboratory work previously was within normal limits, however her erythropoietin levels were  inappropriately normal. Iron stores from today continues to be within normal limits. It is possible patient has underlying MDS, but this would take a bone marrow biopsy to diagnose this is not necessary at this time. Patient's hemoglobin is greater than than 10.0, therefore she does not require 40,000 units subcutaneous Procrit today. Patient appears to only require treatment probably every 6 months. Return to clinic in 6 months with repeat laboratory work and further evaluation.  2.  Questionable leukemia: Patient stated previously that she has a history of "leukemia" that it is in "remission".  We do not have any other mention of this or documentation of this in her  records.  Will consider flow cytometry in the future if clinically applicable. 3. Elevated MCV: Previously B-12 and folate levels are within normal limits. Possibly related to underlying MDS.  Patient expressed understanding and was in agreement with this plan. She also understands that She can call clinic at any time with any questions, concerns, or complaints.    Lloyd Huger, MD   09/29/2016 8:47 AM

## 2016-09-25 ENCOUNTER — Inpatient Hospital Stay: Payer: Medicare Other | Attending: Oncology

## 2016-09-25 ENCOUNTER — Inpatient Hospital Stay: Payer: Medicare Other

## 2016-09-25 ENCOUNTER — Inpatient Hospital Stay (HOSPITAL_BASED_OUTPATIENT_CLINIC_OR_DEPARTMENT_OTHER): Payer: Medicare Other | Admitting: Oncology

## 2016-09-25 VITALS — BP 133/76 | HR 78 | Temp 98.3°F | Resp 18 | Wt 188.4 lb

## 2016-09-25 DIAGNOSIS — E785 Hyperlipidemia, unspecified: Secondary | ICD-10-CM | POA: Diagnosis not present

## 2016-09-25 DIAGNOSIS — R5383 Other fatigue: Secondary | ICD-10-CM

## 2016-09-25 DIAGNOSIS — N189 Chronic kidney disease, unspecified: Secondary | ICD-10-CM | POA: Insufficient documentation

## 2016-09-25 DIAGNOSIS — I509 Heart failure, unspecified: Secondary | ICD-10-CM | POA: Insufficient documentation

## 2016-09-25 DIAGNOSIS — I130 Hypertensive heart and chronic kidney disease with heart failure and stage 1 through stage 4 chronic kidney disease, or unspecified chronic kidney disease: Secondary | ICD-10-CM | POA: Diagnosis not present

## 2016-09-25 DIAGNOSIS — R7989 Other specified abnormal findings of blood chemistry: Secondary | ICD-10-CM | POA: Diagnosis not present

## 2016-09-25 DIAGNOSIS — D464 Refractory anemia, unspecified: Secondary | ICD-10-CM

## 2016-09-25 DIAGNOSIS — R5381 Other malaise: Secondary | ICD-10-CM | POA: Insufficient documentation

## 2016-09-25 DIAGNOSIS — J449 Chronic obstructive pulmonary disease, unspecified: Secondary | ICD-10-CM | POA: Diagnosis not present

## 2016-09-25 DIAGNOSIS — R531 Weakness: Secondary | ICD-10-CM | POA: Diagnosis not present

## 2016-09-25 DIAGNOSIS — Z79899 Other long term (current) drug therapy: Secondary | ICD-10-CM

## 2016-09-25 DIAGNOSIS — M109 Gout, unspecified: Secondary | ICD-10-CM | POA: Diagnosis not present

## 2016-09-25 DIAGNOSIS — E039 Hypothyroidism, unspecified: Secondary | ICD-10-CM | POA: Insufficient documentation

## 2016-09-25 DIAGNOSIS — D508 Other iron deficiency anemias: Secondary | ICD-10-CM

## 2016-09-25 LAB — CBC WITH DIFFERENTIAL/PLATELET
BASOS PCT: 2 %
Basophils Absolute: 0.1 10*3/uL (ref 0–0.1)
Eosinophils Absolute: 0.1 10*3/uL (ref 0–0.7)
Eosinophils Relative: 3 %
HCT: 30.8 % — ABNORMAL LOW (ref 35.0–47.0)
Hemoglobin: 10.3 g/dL — ABNORMAL LOW (ref 12.0–16.0)
Lymphocytes Relative: 28 %
Lymphs Abs: 1.2 10*3/uL (ref 1.0–3.6)
MCH: 34.1 pg — ABNORMAL HIGH (ref 26.0–34.0)
MCHC: 33.4 g/dL (ref 32.0–36.0)
MCV: 101.9 fL — ABNORMAL HIGH (ref 80.0–100.0)
MONO ABS: 0.5 10*3/uL (ref 0.2–0.9)
MONOS PCT: 11 %
Neutro Abs: 2.4 10*3/uL (ref 1.4–6.5)
Neutrophils Relative %: 56 %
PLATELETS: 170 10*3/uL (ref 150–440)
RBC: 3.02 MIL/uL — ABNORMAL LOW (ref 3.80–5.20)
RDW: 14.5 % (ref 11.5–14.5)
WBC: 4.2 10*3/uL (ref 3.6–11.0)

## 2016-09-25 LAB — FERRITIN: FERRITIN: 210 ng/mL (ref 11–307)

## 2016-09-25 LAB — IRON AND TIBC
Iron: 84 ug/dL (ref 28–170)
SATURATION RATIOS: 39 % — AB (ref 10.4–31.8)
TIBC: 215 ug/dL — AB (ref 250–450)
UIBC: 131 ug/dL

## 2017-03-24 ENCOUNTER — Inpatient Hospital Stay: Payer: Medicare Other | Attending: Oncology | Admitting: Oncology

## 2017-03-24 ENCOUNTER — Inpatient Hospital Stay: Payer: Medicare Other

## 2017-03-24 ENCOUNTER — Ambulatory Visit: Payer: Medicare Other

## 2017-03-24 VITALS — BP 154/68 | HR 62 | Temp 97.9°F | Resp 18

## 2017-03-24 DIAGNOSIS — M109 Gout, unspecified: Secondary | ICD-10-CM | POA: Insufficient documentation

## 2017-03-24 DIAGNOSIS — R5383 Other fatigue: Secondary | ICD-10-CM | POA: Diagnosis not present

## 2017-03-24 DIAGNOSIS — I509 Heart failure, unspecified: Secondary | ICD-10-CM | POA: Diagnosis not present

## 2017-03-24 DIAGNOSIS — Z880 Allergy status to penicillin: Secondary | ICD-10-CM | POA: Diagnosis not present

## 2017-03-24 DIAGNOSIS — D464 Refractory anemia, unspecified: Secondary | ICD-10-CM

## 2017-03-24 DIAGNOSIS — R5381 Other malaise: Secondary | ICD-10-CM | POA: Insufficient documentation

## 2017-03-24 DIAGNOSIS — D696 Thrombocytopenia, unspecified: Secondary | ICD-10-CM | POA: Insufficient documentation

## 2017-03-24 DIAGNOSIS — J449 Chronic obstructive pulmonary disease, unspecified: Secondary | ICD-10-CM | POA: Diagnosis not present

## 2017-03-24 DIAGNOSIS — E039 Hypothyroidism, unspecified: Secondary | ICD-10-CM | POA: Insufficient documentation

## 2017-03-24 DIAGNOSIS — Z79899 Other long term (current) drug therapy: Secondary | ICD-10-CM | POA: Insufficient documentation

## 2017-03-24 DIAGNOSIS — F329 Major depressive disorder, single episode, unspecified: Secondary | ICD-10-CM | POA: Diagnosis not present

## 2017-03-24 DIAGNOSIS — N189 Chronic kidney disease, unspecified: Secondary | ICD-10-CM | POA: Diagnosis not present

## 2017-03-24 DIAGNOSIS — M1990 Unspecified osteoarthritis, unspecified site: Secondary | ICD-10-CM | POA: Diagnosis not present

## 2017-03-24 DIAGNOSIS — R531 Weakness: Secondary | ICD-10-CM | POA: Insufficient documentation

## 2017-03-24 DIAGNOSIS — I130 Hypertensive heart and chronic kidney disease with heart failure and stage 1 through stage 4 chronic kidney disease, or unspecified chronic kidney disease: Secondary | ICD-10-CM | POA: Diagnosis not present

## 2017-03-24 DIAGNOSIS — E785 Hyperlipidemia, unspecified: Secondary | ICD-10-CM | POA: Diagnosis not present

## 2017-03-24 DIAGNOSIS — D649 Anemia, unspecified: Secondary | ICD-10-CM

## 2017-03-24 LAB — CBC WITH DIFFERENTIAL/PLATELET
BASOS PCT: 1 %
Basophils Absolute: 0 10*3/uL (ref 0–0.1)
Eosinophils Absolute: 0.1 10*3/uL (ref 0–0.7)
Eosinophils Relative: 2 %
HEMATOCRIT: 29 % — AB (ref 35.0–47.0)
HEMOGLOBIN: 9.8 g/dL — AB (ref 12.0–16.0)
LYMPHS ABS: 1.1 10*3/uL (ref 1.0–3.6)
Lymphocytes Relative: 34 %
MCH: 34.5 pg — AB (ref 26.0–34.0)
MCHC: 33.7 g/dL (ref 32.0–36.0)
MCV: 102.3 fL — ABNORMAL HIGH (ref 80.0–100.0)
MONO ABS: 0.4 10*3/uL (ref 0.2–0.9)
MONOS PCT: 12 %
NEUTROS ABS: 1.7 10*3/uL (ref 1.4–6.5)
Neutrophils Relative %: 51 %
Platelets: 138 10*3/uL — ABNORMAL LOW (ref 150–440)
RBC: 2.84 MIL/uL — ABNORMAL LOW (ref 3.80–5.20)
RDW: 14.2 % (ref 11.5–14.5)
WBC: 3.4 10*3/uL — ABNORMAL LOW (ref 3.6–11.0)

## 2017-03-24 LAB — FERRITIN: Ferritin: 231 ng/mL (ref 11–307)

## 2017-03-24 LAB — IRON AND TIBC
IRON: 105 ug/dL (ref 28–170)
Saturation Ratios: 55 % — ABNORMAL HIGH (ref 10.4–31.8)
TIBC: 190 ug/dL — ABNORMAL LOW (ref 250–450)
UIBC: 85 ug/dL

## 2017-03-24 MED ORDER — EPOETIN ALFA 40000 UNIT/ML IJ SOLN
40000.0000 [IU] | Freq: Once | INTRAMUSCULAR | Status: AC
  Administered 2017-03-24: 40000 [IU] via SUBCUTANEOUS

## 2017-03-24 NOTE — Progress Notes (Signed)
[facility]  Telephone:(336) [phone] Fax:(336) [phone]  ID: SABRIYAH WILCHER OB: December 16, 1930  MR#: [id number]  XEN#:[id number]  Patient Care Team: Zollie Scale, MD as PCP - General (Internal Medicine)  CHIEF COMPLAINT: Refractory anemia, unspecified.   INTERVAL HISTORY: Patient returns to clinic today for further evaluation, laboratory work, and consideration of Procrit.  She continues to complain of persistent weakness and fatigue but otherwise feels well.  She has had no recent fevers or illnesses. She denies any neurologic complaints.  She has no chest pain.  She has a fair appetite and denies weight loss.  She denies any nausea, vomiting, constipation, or diarrhea.  She has no melena or hematochezia.  She has no urinary complaints.  Patient offers no further specific complaints today.  REVIEW OF SYSTEMS:   Review of Systems  Constitutional: Positive for malaise/fatigue. Negative for fever and weight loss.  Respiratory: Negative.  Negative for cough and shortness of breath.   Cardiovascular: Negative.  Negative for chest pain and leg swelling.  Gastrointestinal: Negative.  Negative for abdominal pain, blood in stool and melena.  Genitourinary: Negative.   Musculoskeletal: Negative.   Skin: Negative.  Negative for rash.  Neurological: Positive for weakness.  Psychiatric/Behavioral: Negative.  The patient is not nervous/anxious.     As per HPI. Otherwise, a complete review of systems is negative.  PAST MEDICAL HISTORY: Past Medical History:  Diagnosis Date  . Arthritis   . CHF (congestive heart failure)   . Chronic renal insufficiency   . COPD (chronic obstructive pulmonary disease)   . Depression   . Gout   . Hyperlipidemia   . Hypertension   . Hypothyroidism   . Seizures     PAST SURGICAL HISTORY: Past Surgical History:  Procedure Laterality Date  . APPENDECTOMY    . CHOLECYSTECTOMY    . PARTIAL HIP ARTHROPLASTY Right     FAMILY HISTORY:  Reviewed and unchanged. No report of malignancy or chronic disease.     ADVANCED DIRECTIVES:    HEALTH MAINTENANCE: Social History  Substance Use Topics  . Smoking status: Not on file  . Smokeless tobacco: Not on file  . Alcohol use Not on file     Colonoscopy:  PAP:  Bone density:  Lipid panel:  Allergies  Allergen Reactions  . Alprazolam Other (See Comments)    "makes me climb the wall"  . Hydrocodone-Acetaminophen Other (See Comments)    ' Drives me wild"  . Other Other (See Comments)    Pollen, grass, dust, lint, cold air, hot humid air, pine causes "stuffiness" in head  . Pineapple Nausea And Vomiting  . Prednisone Other (See Comments)    "Stresses me out"  . Latex Rash  . Penicillins Rash    Current Outpatient Prescriptions  Medication Sig Dispense Refill  . allopurinol (ZYLOPRIM) 100 MG tablet Take 100 mg by mouth daily.     Marland Kitchen dicyclomine (BENTYL) 10 MG capsule Take 10 mg by mouth 4 (four) times daily -  before meals and at bedtime.    . diphenhydrAMINE (BENADRYL) 25 MG tablet Take 25 mg by mouth at bedtime as needed.    . doxepin (SINEQUAN) 50 MG capsule Take 50 mg by mouth daily.    . ENDOCET 5-325 MG per tablet     . ergocalciferol (VITAMIN D2) 50000 UNITS capsule Take 50,000 Units by mouth every 30 (thirty) days.    . folic acid (FOLVITE) 1 MG tablet Take 1 mg by mouth daily.    Marland Kitchen  furosemide (LASIX) 40 MG tablet Take 40 mg by mouth 2 (two) times daily.    . hydrALAZINE (APRESOLINE) 100 MG tablet Take 100 mg by mouth daily.    . isosorbide mononitrate (IMDUR) 30 MG 24 hr tablet Take 30 mg by mouth daily.     Marland Kitchen levothyroxine (SYNTHROID, LEVOTHROID) 25 MCG tablet Take 25 mcg by mouth daily.     Marland Kitchen lovastatin (MEVACOR) 20 MG tablet Take 20 mg by mouth daily.    . pantoprazole (PROTONIX) 40 MG tablet Take 40 mg by mouth daily.     . phenytoin (DILANTIN) 100 MG ER capsule Take 100 mg by mouth daily.     . polyethylene glycol powder (GLYCOLAX/MIRALAX) powder       . sertraline (ZOLOFT) 100 MG tablet Take 100 mg by mouth daily.     . traZODone (DESYREL) 100 MG tablet as needed for Sleep.     No current facility-administered medications for this visit.     OBJECTIVE: Vitals:   03/24/17 1352  BP: (!) 154/68  Pulse: 62  Resp: 18  Temp: 97.9 F (36.6 C)     There is no height or weight on file to calculate BMI.    ECOG FS:2 - Symptomatic, <50% confined to bed  General: Well-developed, well-nourished, no acute distress. Eyes: anicteric sclera. Lungs: Clear to auscultation bilaterally. Heart: Regular rate and rhythm. No rubs, murmurs, or gallops. Abdomen: Soft, nontender, nondistended. No organomegaly noted, normoactive bowel sounds. Musculoskeletal: No edema, cyanosis, or clubbing. Neuro: Alert, answering all questions appropriately. Cranial nerves grossly intact. Skin: No rashes or petechiae noted. Psych: Normal affect.   LAB RESULTS:  Lab Results  Component Value Date   NA 141 11/23/2014   K 3.9 11/23/2014   CL 108 11/23/2014   CO2 25 11/23/2014   GLUCOSE 115 (H) 11/23/2014   BUN 52 (H) 11/23/2014   CREATININE 1.44 (H) 11/23/2014   CALCIUM 9.0 11/23/2014   PROT 7.0 11/23/2014   ALBUMIN 4.4 11/23/2014   AST 20 11/23/2014   ALT 12 (L) 11/23/2014   ALKPHOS 125 11/23/2014   BILITOT 0.4 11/23/2014   GFRNONAA 34 (L) 11/23/2014   GFRAA 39 (L) 11/23/2014    Lab Results  Component Value Date   WBC 3.4 (L) 03/24/2017   NEUTROABS 1.7 03/24/2017   HGB 9.8 (L) 03/24/2017   HCT 29.0 (L) 03/24/2017   MCV 102.3 (H) 03/24/2017   PLT 138 (L) 03/24/2017   Lab Results  Component Value Date   IRON 105 03/24/2017   TIBC 190 (L) 03/24/2017   IRONPCTSAT 55 (H) 03/24/2017   Lab Results  Component Value Date   FERRITIN 231 03/24/2017     STUDIES: No results found.  ASSESSMENT: Refractory anemia, unspecified.   PLAN:    1.  Refractory anemia, unspecified: All of the patient's laboratory work previously was within normal limits,  however her erythropoietin levels were inappropriately normal. Iron stores from today continues to be within normal limits. It is possible patient has underlying MDS, but this would take a bone marrow biopsy to diagnose this is not necessary at this time. Patient's hemoglobin is less than 10.0, therefore she will receive 40,000 units subcutaneous Procrit today. Patient appears to only require treatment about every 6 months. Return to clinic in 6 months with repeat laboratory work and further evaluation.  2.  Questionable leukemia: Patient stated previously that she has a history of "leukemia" that it is in "remission".  We do not have any other mention of  this or documentation of this in her records.  Will consider flow cytometry in the future if clinically applicable. 3. Elevated MCV: Previously B-12 and folate levels are within normal limits. Possibly related to underlying MDS. 4. Thrombocytopenia: Mild, monitor.  Patient expressed understanding and was in agreement with this plan. She also understands that She can call clinic at any time with any questions, concerns, or complaints.    Lloyd Huger, MD   03/25/2017 9:38 PM

## 2017-03-26 ENCOUNTER — Inpatient Hospital Stay: Payer: Medicare Other

## 2017-03-26 ENCOUNTER — Inpatient Hospital Stay: Payer: Medicare Other | Admitting: Oncology

## 2017-09-19 ENCOUNTER — Other Ambulatory Visit: Payer: Self-pay | Admitting: *Deleted

## 2017-09-19 DIAGNOSIS — D509 Iron deficiency anemia, unspecified: Secondary | ICD-10-CM

## 2017-09-19 NOTE — Progress Notes (Signed)
c 

## 2017-09-21 NOTE — Progress Notes (Deleted)
[place]  Telephone:(336) [phone] Fax:(336) [phone]  ID: AVELEEN NEVERS OB: November 28, 1930  MR#: [id number]  RXV#:[id number]  Patient Care Team: Zollie Scale, MD as PCP - General (Internal Medicine)  CHIEF COMPLAINT: Refractory anemia, unspecified.   INTERVAL HISTORY: Patient returns to clinic today for further evaluation, laboratory work, and consideration of Procrit.  She continues to complain of persistent weakness and fatigue but otherwise feels well.  She has had no recent fevers or illnesses. She denies any neurologic complaints.  She has no chest pain.  She has a fair appetite and denies weight loss.  She denies any nausea, vomiting, constipation, or diarrhea.  She has no melena or hematochezia.  She has no urinary complaints.  Patient offers no further specific complaints today.  REVIEW OF SYSTEMS:   Review of Systems  Constitutional: Positive for malaise/fatigue. Negative for fever and weight loss.  Respiratory: Negative.  Negative for cough and shortness of breath.   Cardiovascular: Negative.  Negative for chest pain and leg swelling.  Gastrointestinal: Negative.  Negative for abdominal pain, blood in stool and melena.  Genitourinary: Negative.   Musculoskeletal: Negative.   Skin: Negative.  Negative for rash.  Neurological: Positive for weakness.  Psychiatric/Behavioral: Negative.  The patient is not nervous/anxious.     As per HPI. Otherwise, a complete review of systems is negative.  PAST MEDICAL HISTORY: Past Medical History:  Diagnosis Date  . Arthritis   . CHF (congestive heart failure)   . Chronic renal insufficiency   . COPD (chronic obstructive pulmonary disease)   . Depression   . Gout   . Hyperlipidemia   . Hypertension   . Hypothyroidism   . Seizures     PAST SURGICAL HISTORY: *** The histories are not reviewed yet. Please review them in the "History" navigator section and refresh this [place].  FAMILY HISTORY: Reviewed  and unchanged. No report of malignancy or chronic disease.     ADVANCED DIRECTIVES:    HEALTH MAINTENANCE: Social History   Tobacco Use  . Smoking status: Not on file  Substance Use Topics  . Alcohol use: Not on file  . Drug use: Not on file     Colonoscopy:  PAP:  Bone density:  Lipid panel:  Allergies  Allergen Reactions  . Alprazolam Other (See Comments)    "makes me climb the wall"  . Hydrocodone-Acetaminophen Other (See Comments)    ' Drives me wild"  . Other Other (See Comments)    Pollen, grass, dust, lint, cold air, hot humid air, pine causes "stuffiness" in head  . Pineapple Nausea And Vomiting  . Prednisone Other (See Comments)    "Stresses me out"  . Latex Rash  . Penicillins Rash    Current Outpatient Medications  Medication Sig Dispense Refill  . allopurinol (ZYLOPRIM) 100 MG tablet Take 100 mg by mouth daily.     Marland Kitchen dicyclomine (BENTYL) 10 MG capsule Take 10 mg by mouth 4 (four) times daily -  before meals and at bedtime.    . diphenhydrAMINE (BENADRYL) 25 MG tablet Take 25 mg by mouth at bedtime as needed.    . doxepin (SINEQUAN) 50 MG capsule Take 50 mg by mouth daily.    . ENDOCET 5-325 MG per tablet     . ergocalciferol (VITAMIN D2) 50000 UNITS capsule Take 50,000 Units by mouth every 30 (thirty) days.    . folic acid (FOLVITE) 1 MG tablet Take 1 mg by mouth daily.    . furosemide (  LASIX) 40 MG tablet Take 40 mg by mouth 2 (two) times daily.    . hydrALAZINE (APRESOLINE) 100 MG tablet Take 100 mg by mouth daily.    . isosorbide mononitrate (IMDUR) 30 MG 24 hr tablet Take 30 mg by mouth daily.     Marland Kitchen levothyroxine (SYNTHROID, LEVOTHROID) 25 MCG tablet Take 25 mcg by mouth daily.     Marland Kitchen lovastatin (MEVACOR) 20 MG tablet Take 20 mg by mouth daily.    . pantoprazole (PROTONIX) 40 MG tablet Take 40 mg by mouth daily.     . phenytoin (DILANTIN) 100 MG ER capsule Take 100 mg by mouth daily.     . polyethylene glycol powder (GLYCOLAX/MIRALAX) powder     .  sertraline (ZOLOFT) 100 MG tablet Take 100 mg by mouth daily.     . traZODone (DESYREL) 100 MG tablet as needed for Sleep.     No current facility-administered medications for this visit.     OBJECTIVE: There were no vitals filed for this visit.   There is no height or weight on file to calculate BMI.    ECOG FS:2 - Symptomatic, <50% confined to bed  General: Well-developed, well-nourished, no acute distress. Eyes: anicteric sclera. Lungs: Clear to auscultation bilaterally. Heart: Regular rate and rhythm. No rubs, murmurs, or gallops. Abdomen: Soft, nontender, nondistended. No organomegaly noted, normoactive bowel sounds. Musculoskeletal: No edema, cyanosis, or clubbing. Neuro: Alert, answering all questions appropriately. Cranial nerves grossly intact. Skin: No rashes or petechiae noted. Psych: Normal affect.   LAB RESULTS:  Lab Results  Component Value Date   NA 141 11/23/2014   K 3.9 11/23/2014   CL 108 11/23/2014   CO2 25 11/23/2014   GLUCOSE 115 (H) 11/23/2014   BUN 52 (H) 11/23/2014   CREATININE 1.44 (H) 11/23/2014   CALCIUM 9.0 11/23/2014   PROT 7.0 11/23/2014   ALBUMIN 4.4 11/23/2014   AST 20 11/23/2014   ALT 12 (L) 11/23/2014   ALKPHOS 125 11/23/2014   BILITOT 0.4 11/23/2014   GFRNONAA 34 (L) 11/23/2014   GFRAA 39 (L) 11/23/2014    Lab Results  Component Value Date   WBC 3.4 (L) 03/24/2017   NEUTROABS 1.7 03/24/2017   HGB 9.8 (L) 03/24/2017   HCT 29.0 (L) 03/24/2017   MCV 102.3 (H) 03/24/2017   PLT 138 (L) 03/24/2017   Lab Results  Component Value Date   IRON 105 03/24/2017   TIBC 190 (L) 03/24/2017   IRONPCTSAT 55 (H) 03/24/2017   Lab Results  Component Value Date   FERRITIN 231 03/24/2017     STUDIES: No results found.  ASSESSMENT: Refractory anemia, unspecified.   PLAN:    1.  Refractory anemia, unspecified: All of the patient's laboratory work previously was within normal limits, however her erythropoietin levels were inappropriately  normal. Iron stores from today continues to be within normal limits. It is possible patient has underlying MDS, but this would take a bone marrow biopsy to diagnose this is not necessary at this time. Patient's hemoglobin is less than 10.0, therefore she will receive 40,000 units subcutaneous Procrit today. Patient appears to only require treatment about every 6 months. Return to clinic in 6 months with repeat laboratory work and further evaluation.  2.  Questionable leukemia: Patient stated previously that she has a history of "leukemia" that it is in "remission".  We do not have any other mention of this or documentation of this in her records.  Will consider flow cytometry in the future if  clinically applicable. 3. Elevated MCV: Previously B-12 and folate levels are within normal limits. Possibly related to underlying MDS. 4. Thrombocytopenia: Mild, monitor.  Patient expressed understanding and was in agreement with this plan. She also understands that She can call clinic at any time with any questions, concerns, or complaints.    Lloyd Huger, MD   09/21/2017 11:36 PM

## 2017-09-22 ENCOUNTER — Inpatient Hospital Stay: Payer: Medicare Other

## 2017-09-22 ENCOUNTER — Inpatient Hospital Stay: Payer: Medicare Other | Admitting: Oncology

## 2017-09-23 ENCOUNTER — Telehealth: Payer: Self-pay | Admitting: *Deleted

## 2017-09-23 NOTE — Telephone Encounter (Signed)
Patient advised that doctor will not give prescription for home injection of EPO. She states she will not be able to come in any longer and that having home health check labs would do no good if we still require her to come in for injection, because she just can't do it

## 2017-09-23 NOTE — Telephone Encounter (Signed)
Phone call to patient to advise that will not give prescription for Epogen for home administration. Got voice mail and left message for her to return my call

## 2017-09-23 NOTE — Telephone Encounter (Signed)
Per Dr Grayland Ormond, will not write for her to Epo at home, can order labs for Home Health to draw to see if she needs to come in for injection

## 2017-09-23 NOTE — Telephone Encounter (Addendum)
Patient called to request that doctor send prescription to pharmacy for her epogen so that her nurse can give it to her at home as she is having difficulty getting around. She cancelled her 1/21 appointment due to being sick. Please advise

## 2018-07-13 ENCOUNTER — Other Ambulatory Visit: Payer: Self-pay

## 2018-07-13 ENCOUNTER — Emergency Department
Admission: EM | Admit: 2018-07-13 | Discharge: 2018-07-13 | Disposition: A | Discharge status: Home / Self Care | Payer: Medicare HMO | Attending: Emergency Medicine | Admitting: Emergency Medicine

## 2018-07-13 ENCOUNTER — Encounter: Payer: Self-pay | Admitting: Emergency Medicine

## 2018-07-13 DIAGNOSIS — I110 Hypertensive heart disease with heart failure: Secondary | ICD-10-CM | POA: Insufficient documentation

## 2018-07-13 DIAGNOSIS — J449 Chronic obstructive pulmonary disease, unspecified: Secondary | ICD-10-CM | POA: Diagnosis not present

## 2018-07-13 DIAGNOSIS — Y999 Unspecified external cause status: Secondary | ICD-10-CM | POA: Diagnosis not present

## 2018-07-13 DIAGNOSIS — Y939 Activity, unspecified: Secondary | ICD-10-CM | POA: Diagnosis not present

## 2018-07-13 DIAGNOSIS — Z5189 Encounter for other specified aftercare: Secondary | ICD-10-CM

## 2018-07-13 DIAGNOSIS — S71001A Unspecified open wound, right hip, initial encounter: Secondary | ICD-10-CM | POA: Diagnosis present

## 2018-07-13 DIAGNOSIS — Z79899 Other long term (current) drug therapy: Secondary | ICD-10-CM | POA: Insufficient documentation

## 2018-07-13 DIAGNOSIS — Y929 Unspecified place or not applicable: Secondary | ICD-10-CM | POA: Insufficient documentation

## 2018-07-13 DIAGNOSIS — I509 Heart failure, unspecified: Secondary | ICD-10-CM | POA: Insufficient documentation

## 2018-07-13 DIAGNOSIS — X58XXXA Exposure to other specified factors, initial encounter: Secondary | ICD-10-CM | POA: Insufficient documentation

## 2018-07-13 DIAGNOSIS — E039 Hypothyroidism, unspecified: Secondary | ICD-10-CM | POA: Diagnosis not present

## 2018-07-13 DIAGNOSIS — Z91040 Latex allergy status: Secondary | ICD-10-CM | POA: Diagnosis not present

## 2018-07-13 NOTE — ED Provider Notes (Signed)
[facility] Emergency Department Provider Note  ____________________________________________   None    (approximate)  I have reviewed the triage vital signs and the nursing notes.   HISTORY  Chief Complaint Wound Check   HPI Gabrielle Sanders is a 84 y.o. female who presents to the emergency department for treatment and evaluation of a wound on her right posterior hip that was discovered by her aide earlier today.  Patient denies complaint.  She has had no fever, nausea, vomiting, diarrhea, or dysuria.  She is unsure how long the wound has been on her hip.  She states that she was unaware that she was coming to the emergency department until the EMS arrived.  She states that she is in her usual state of health without any new complaints. Vague history of a fall 2 weeks ago, however patient denies hip/back pain.   Past Medical History:  Diagnosis Date  . Arthritis   . CHF (congestive heart failure) ([place])   . Chronic renal insufficiency   . COPD (chronic obstructive pulmonary disease) ([place])   . Depression   . Gout   . Hyperlipidemia   . Hypertension   . Hypothyroidism   . Seizures [facility])     Patient Active Problem List   Diagnosis Date Noted  . Refractory anemia, unspecified ([place]) 01/23/2015    Past Surgical History:  Procedure Laterality Date  . APPENDECTOMY    . CHOLECYSTECTOMY    . PARTIAL HIP ARTHROPLASTY Right     Prior to Admission medications   Medication Sig Start Date End Date Taking? Authorizing Provider  allopurinol (ZYLOPRIM) 100 MG tablet Take 100 mg by mouth daily.  01/18/15   [provider]  dicyclomine (BENTYL) 10 MG capsule Take 10 mg by mouth 4 (four) times daily -  before meals and at bedtime.    [provider]  diphenhydrAMINE (BENADRYL) 25 MG tablet Take 25 mg by mouth at bedtime as needed.    [provider]  doxepin (SINEQUAN) 50 MG capsule Take 50 mg by mouth daily. 09/10/16   [provider]  ENDOCET 5-325 MG per tablet  12/28/14   [provider]  ergocalciferol (VITAMIN D2) 50000 UNITS capsule Take 50,000 Units by mouth every 30 (thirty) days.    [provider]  folic acid (FOLVITE) 1 MG tablet Take 1 mg by mouth daily.    [provider]  furosemide (LASIX) 40 MG tablet Take 40 mg by mouth 2 (two) times daily. 09/10/16   [provider]  hydrALAZINE (APRESOLINE) 100 MG tablet Take 100 mg by mouth daily. 09/10/16   [provider]  isosorbide mononitrate (IMDUR) 30 MG 24 hr tablet Take 30 mg by mouth daily.  09/10/16   [provider]  levothyroxine (SYNTHROID, LEVOTHROID) 25 MCG tablet Take 25 mcg by mouth daily.     [provider]  lovastatin (MEVACOR) 20 MG tablet Take 20 mg by mouth daily. 09/10/16   [provider]  pantoprazole (PROTONIX) 40 MG tablet Take 40 mg by mouth daily.  01/18/15   [provider]  phenytoin (DILANTIN) 100 MG ER capsule Take 100 mg by mouth daily.  09/12/16   [provider]  polyethylene glycol powder (GLYCOLAX/MIRALAX) powder  09/10/16   [provider]  sertraline (ZOLOFT) 100 MG tablet Take 100 mg by mouth daily.  09/12/16   [provider]  traZODone (DESYREL) 100 MG tablet as needed for Sleep.    [provider]    Allergies Alprazolam; Hydrocodone-acetaminophen; Other; Pineapple; Prednisone; Latex; and Penicillins  No family history on file.  Social History Social History   Tobacco Use  . Smoking status: Not on file  Substance Use Topics  . Alcohol use: Not on file  . Drug use: Not on file    Review of Systems  Constitutional: No fever/chills Eyes: No visual changes. ENT: No sore throat. Cardiovascular: Denies chest pain. Respiratory: Denies shortness of breath. Gastrointestinal: No abdominal pain.  No nausea, no vomiting.  No diarrhea.  No constipation. Genitourinary: Negative for dysuria. Musculoskeletal:  Negative for back pain. Skin: Negative for rash. Positive for wound to the right hip. Neurological: Negative for headaches, focal weakness or numbness. ____________________________________________   PHYSICAL EXAM:  VITAL SIGNS: ED Triage Vitals  Enc Vitals Group     BP 07/13/18 1835 (!) 144/57     Pulse Rate 07/13/18 1831 64     Resp 07/13/18 1831 16     Temp 07/13/18 1831 98.1 F (36.7 C)     Temp Source 07/13/18 1831 Oral     SpO2 07/13/18 1830 100 %     Weight 07/13/18 1834 140 lb (63.5 kg)     Height 07/13/18 1834 5\' 2"  (1.575 m)     Head Circumference --      Peak Flow --      Pain Score 07/13/18 1834 0     Pain Loc --      Pain Edu? --      Excl. in [place]? --     Constitutional: Alert and oriented. Well appearing and in no acute distress. Eyes: Conjunctivae are normal. Head: Atraumatic. Nose: No congestion/rhinnorhea. Mouth/Throat: Mucous membranes are moist.  Oropharynx non-erythematous. Neck: No stridor.   Cardiovascular: Normal rate, regular rhythm. Grossly normal heart sounds.  Good peripheral circulation. Respiratory: Normal respiratory effort.  No retractions. Lungs CTAB. Gastrointestinal: Soft and nontender. No distention. No abdominal bruits. No CVA tenderness. Musculoskeletal: No lower extremity tenderness nor edema.  No joint effusions. Neurologic:  Normal speech and language. No gross focal neurologic deficits are appreciated. No gait instability. Skin:  2cm contused area with 1cm linear abrasion on right posterior hip in the area of the right inferior pubic ramis. Toenails are hyperkeratotic, but feet otherwise are without lesion or wound. Psychiatric: Mood and affect are normal. Speech and behavior are normal.  ____________________________________________   LABS (all labs ordered are listed, but only abnormal results are displayed)  Labs Reviewed - No data to display ____________________________________________  EKG  Not  indicated. ____________________________________________  RADIOLOGY  ED MD interpretation:  Not indicated.  Official radiology report(s): No results found.  ____________________________________________   PROCEDURES  Procedure(s) performed:   Procedures  Critical Care performed: No  ____________________________________________   INITIAL IMPRESSION / ASSESSMENT AND PLAN / ED COURSE  As part of my medical decision making, I reviewed the following data within the electronic MEDICAL RECORD NUMBER History obtained from family   84 year old female presenting to the emergency department for evaluation of a wound to the right posterior hip. I spoke with her husband to ensure I had not missed other concern or reason for visit since the patient wasn't quite sure why she was transported to the hospital. Wound on her hip was dressed. She is to follow up with her PCP for a recheck. Patient agrees to plan and would like to return home.      ____________________________________________   FINAL CLINICAL IMPRESSION(S) / ED DIAGNOSES  Final  diagnoses:  Visit for wound check     ED Discharge Orders    None       Note:  This document was prepared using Dragon voice recognition software and may include unintentional dictation errors.    Victorino Dike, FNP 07/13/18 1940    Harvest Dark, MD 07/13/18 2024

## 2018-07-13 NOTE — Discharge Instructions (Signed)
Please follow up with your primary care doctor later this week for a wound check.  If unable to see your doctor right away, return to the ER for sign or concern of infection.

## 2018-07-13 NOTE — ED Triage Notes (Signed)
Pt from home with home health aid care, aide called Social worker with concerns of wound to rt hip from a fall 2 weeks ago. Pt denies pain.

## 2018-07-13 NOTE — ED Provider Notes (Signed)
Evaluated patient at the bedside with NP [place].  Patient with small appearing abrasion versus pressure ulcer to the right buttock.  Does not appear to need further emergent care at this time.  Agree with plan for discharge.   Orbie Pyo, MD 07/13/18 Lurena Nida

## 2018-07-28 ENCOUNTER — Encounter: Payer: Self-pay | Admitting: Podiatry

## 2018-07-28 ENCOUNTER — Ambulatory Visit (INDEPENDENT_AMBULATORY_CARE_PROVIDER_SITE_OTHER): Payer: Medicare HMO | Admitting: Podiatry

## 2018-07-28 DIAGNOSIS — B351 Tinea unguium: Secondary | ICD-10-CM | POA: Diagnosis not present

## 2018-07-28 DIAGNOSIS — M79676 Pain in unspecified toe(s): Secondary | ICD-10-CM | POA: Diagnosis not present

## 2018-07-28 DIAGNOSIS — M79609 Pain in unspecified limb: Principal | ICD-10-CM

## 2018-07-29 NOTE — Progress Notes (Signed)
   SUBJECTIVE Patient presents to office today complaining of elongated, thickened nails that cause pain while ambulating in shoes. She is unable to trim her own nails. Patient is here for further evaluation and treatment.  Past Medical History:  Diagnosis Date  . Arthritis   . CHF (congestive heart failure) ([place])   . Chronic renal insufficiency   . COPD (chronic obstructive pulmonary disease) ([place])   . Depression   . Gout   . Hyperlipidemia   . Hypertension   . Hypothyroidism   . Seizures ([place])     OBJECTIVE General Patient is awake, alert, and oriented x 3 and in no acute distress. Derm Skin is dry and supple bilateral. Negative open lesions or macerations. Remaining integument unremarkable. Nails are tender, long, thickened and dystrophic with subungual debris, consistent with onychomycosis, 1-5 bilateral. No signs of infection noted. Vasc  DP and PT pedal pulses palpable bilaterally. Temperature gradient within normal limits.  Neuro Epicritic and protective threshold sensation grossly intact bilaterally.  Musculoskeletal Exam No symptomatic pedal deformities noted bilateral. Muscular strength within normal limits.  ASSESSMENT 1. Onychodystrophic nails 1-5 bilateral with hyperkeratosis of nails.  2. Onychomycosis of nail due to dermatophyte bilateral 3. Pain in foot bilateral  PLAN OF CARE 1. Patient evaluated today.  2. Instructed to maintain good pedal hygiene and foot care.  3. Mechanical debridement of nails 1-5 bilaterally performed using a nail nipper. Filed with dremel without incident.  4. Return to clinic in 3 mos.    Edrick Kins, DPM [facility]  Dr. Edrick Kins, [address]                                        [place],  [address]                Office [phone]  Fax [phone]

## 2019-03-26 ENCOUNTER — Emergency Department: Payer: Medicare HMO

## 2019-03-26 ENCOUNTER — Emergency Department
Admission: EM | Admit: 2019-03-26 | Discharge: 2019-03-27 | Disposition: A | Discharge status: Home / Self Care | Payer: Medicare HMO | Attending: Emergency Medicine | Admitting: Emergency Medicine

## 2019-03-26 DIAGNOSIS — Z79899 Other long term (current) drug therapy: Secondary | ICD-10-CM | POA: Insufficient documentation

## 2019-03-26 DIAGNOSIS — E039 Hypothyroidism, unspecified: Secondary | ICD-10-CM | POA: Diagnosis not present

## 2019-03-26 DIAGNOSIS — Z9049 Acquired absence of other specified parts of digestive tract: Secondary | ICD-10-CM | POA: Insufficient documentation

## 2019-03-26 DIAGNOSIS — I509 Heart failure, unspecified: Secondary | ICD-10-CM | POA: Insufficient documentation

## 2019-03-26 DIAGNOSIS — W06XXXA Fall from bed, initial encounter: Secondary | ICD-10-CM | POA: Diagnosis not present

## 2019-03-26 DIAGNOSIS — Z91040 Latex allergy status: Secondary | ICD-10-CM | POA: Diagnosis not present

## 2019-03-26 DIAGNOSIS — R410 Disorientation, unspecified: Secondary | ICD-10-CM | POA: Diagnosis not present

## 2019-03-26 DIAGNOSIS — J449 Chronic obstructive pulmonary disease, unspecified: Secondary | ICD-10-CM | POA: Diagnosis not present

## 2019-03-26 DIAGNOSIS — Z96641 Presence of right artificial hip joint: Secondary | ICD-10-CM | POA: Diagnosis not present

## 2019-03-26 DIAGNOSIS — I130 Hypertensive heart and chronic kidney disease with heart failure and stage 1 through stage 4 chronic kidney disease, or unspecified chronic kidney disease: Secondary | ICD-10-CM | POA: Diagnosis not present

## 2019-03-26 DIAGNOSIS — Y92013 Bedroom of single-family (private) house as the place of occurrence of the external cause: Secondary | ICD-10-CM | POA: Diagnosis not present

## 2019-03-26 DIAGNOSIS — N189 Chronic kidney disease, unspecified: Secondary | ICD-10-CM | POA: Diagnosis not present

## 2019-03-26 DIAGNOSIS — Z043 Encounter for examination and observation following other accident: Secondary | ICD-10-CM | POA: Diagnosis present

## 2019-03-26 DIAGNOSIS — W19XXXA Unspecified fall, initial encounter: Secondary | ICD-10-CM

## 2019-03-26 LAB — URINALYSIS, COMPLETE (UACMP) WITH MICROSCOPIC
Bilirubin Urine: NEGATIVE
Glucose, UA: NEGATIVE mg/dL
Hgb urine dipstick: NEGATIVE
Ketones, ur: NEGATIVE mg/dL
Leukocytes,Ua: NEGATIVE
Nitrite: NEGATIVE
Protein, ur: NEGATIVE mg/dL
Specific Gravity, Urine: 1.013 (ref 1.005–1.030)
pH: 5 (ref 5.0–8.0)

## 2019-03-26 LAB — COMPREHENSIVE METABOLIC PANEL
ALT: 9 U/L (ref 0–44)
AST: 17 U/L (ref 15–41)
Albumin: 4 g/dL (ref 3.5–5.0)
Alkaline Phosphatase: 118 U/L (ref 38–126)
Anion gap: 8 (ref 5–15)
BUN: 41 mg/dL — ABNORMAL HIGH (ref 8–23)
CO2: 25 mmol/L (ref 22–32)
Calcium: 8.7 mg/dL — ABNORMAL LOW (ref 8.9–10.3)
Chloride: 107 mmol/L (ref 98–111)
Creatinine, Ser: 1.51 mg/dL — ABNORMAL HIGH (ref 0.44–1.00)
GFR calc Af Amer: 36 mL/min — ABNORMAL LOW (ref >60)
GFR calc non Af Amer: 31 mL/min — ABNORMAL LOW (ref >60)
Glucose, Bld: 150 mg/dL — ABNORMAL HIGH (ref 70–99)
Potassium: 3.9 mmol/L (ref 3.5–5.1)
Sodium: 140 mmol/L (ref 135–145)
Total Bilirubin: 0.4 mg/dL (ref 0.3–1.2)
Total Protein: 6.3 g/dL — ABNORMAL LOW (ref 6.5–8.1)

## 2019-03-26 LAB — CBC WITH DIFFERENTIAL/PLATELET
Abs Immature Granulocytes: 0.01 10*3/uL (ref 0.00–0.07)
Basophils Absolute: 0 10*3/uL (ref 0.0–0.1)
Basophils Relative: 1 %
Eosinophils Absolute: 0.1 10*3/uL (ref 0.0–0.5)
Eosinophils Relative: 4 %
HCT: 26.5 % — ABNORMAL LOW (ref 36.0–46.0)
Hemoglobin: 8.5 g/dL — ABNORMAL LOW (ref 12.0–15.0)
Immature Granulocytes: 0 %
Lymphocytes Relative: 25 %
Lymphs Abs: 1 10*3/uL (ref 0.7–4.0)
MCH: 33.7 pg (ref 26.0–34.0)
MCHC: 32.1 g/dL (ref 30.0–36.0)
MCV: 105.2 fL — ABNORMAL HIGH (ref 80.0–100.0)
Monocytes Absolute: 0.4 10*3/uL (ref 0.1–1.0)
Monocytes Relative: 11 %
Neutro Abs: 2.3 10*3/uL (ref 1.7–7.7)
Neutrophils Relative %: 59 %
Platelets: 151 10*3/uL (ref 150–400)
RBC: 2.52 MIL/uL — ABNORMAL LOW (ref 3.87–5.11)
RDW: 14.3 % (ref 11.5–15.5)
WBC: 3.8 10*3/uL — ABNORMAL LOW (ref 4.0–10.5)
nRBC: 0 % (ref 0.0–0.2)

## 2019-03-26 NOTE — Discharge Instructions (Addendum)
Your labs and CT scans today were all okay.  Please follow-up with your primary care doctor for further evaluation of your symptoms.

## 2019-03-26 NOTE — ED Provider Notes (Signed)
[facility] Emergency Department Provider Note  ____________________________________________  Time seen: Approximately 11:04 PM  I have reviewed the triage vital signs and the nursing notes.   HISTORY  Chief Complaint Fall and Weakness    HPI Gabrielle Sanders is a 85 y.o. female with a history of CHF COPD hypertension hyperlipidemia hypothyroidism who is brought to the ED by EMS due to fall at home.  She reports being wheelchair-bound at home, and was trying to transfer to the toilet today when she fell.  She denies any acute symptoms or pain.  No fever or dysuria.  No abdominal pain chest pain or shortness of breath.  No vision changes.  She is hard of hearing and blind which she reports are chronic issues for her.      Past Medical History:  Diagnosis Date  . Arthritis   . CHF (congestive heart failure) ([place])   . Chronic renal insufficiency   . COPD (chronic obstructive pulmonary disease) ([place])   . Depression   . Gout   . Hyperlipidemia   . Hypertension   . Hypothyroidism   . Seizures [facility])      Patient Active Problem List   Diagnosis Date Noted  . Refractory anemia, unspecified ([place]) 01/23/2015     Past Surgical History:  Procedure Laterality Date  . APPENDECTOMY    . CHOLECYSTECTOMY    . PARTIAL HIP ARTHROPLASTY Right      Prior to Admission medications   Medication Sig Start Date End Date Taking? Authorizing Provider  allopurinol (ZYLOPRIM) 100 MG tablet Take 100 mg by mouth daily.  01/18/15   [provider]  dicyclomine (BENTYL) 10 MG capsule Take 10 mg by mouth 4 (four) times daily -  before meals and at bedtime.    [provider]  diphenhydrAMINE (BENADRYL) 25 MG tablet Take 25 mg by mouth at bedtime as needed.    [provider]  doxepin (SINEQUAN) 50 MG capsule Take 50 mg by mouth daily. 09/10/16   [provider]  ENDOCET 5-325 MG per tablet  12/28/14   [provider]   ergocalciferol (VITAMIN D2) 50000 UNITS capsule Take 50,000 Units by mouth every 30 (thirty) days.    [provider]  folic acid (FOLVITE) 1 MG tablet Take 1 mg by mouth daily.    [provider]  furosemide (LASIX) 40 MG tablet Take 40 mg by mouth 2 (two) times daily. 09/10/16   [provider]  hydrALAZINE (APRESOLINE) 100 MG tablet Take 100 mg by mouth daily. 09/10/16   [provider]  isosorbide mononitrate (IMDUR) 30 MG 24 hr tablet Take 30 mg by mouth daily.  09/10/16   [provider]  levothyroxine (SYNTHROID, LEVOTHROID) 25 MCG tablet Take 25 mcg by mouth daily.     [provider]  lovastatin (MEVACOR) 20 MG tablet Take 20 mg by mouth daily. 09/10/16   [provider]  pantoprazole (PROTONIX) 40 MG tablet Take 40 mg by mouth daily.  01/18/15   [provider]  phenytoin (DILANTIN) 100 MG ER capsule Take 100 mg by mouth daily.  09/12/16   [provider]  polyethylene glycol powder (GLYCOLAX/MIRALAX) powder  09/10/16   [provider]  sertraline (ZOLOFT) 100 MG tablet Take 100 mg by mouth daily.  09/12/16   [provider]  traZODone (DESYREL) 100 MG tablet Take 100 mg by mouth at bedtime.     [provider]     Allergies Alprazolam,  Hydrocodone-acetaminophen, Other, Pineapple, Prednisone, Latex, and Penicillins   History reviewed. No pertinent family history.  Social History Social History   Tobacco Use  . Smoking status: Never Smoker  . Smokeless tobacco: Never Used  Substance Use Topics  . Alcohol use: Not on file  . Drug use: Not on file    Review of Systems  Constitutional:   No fever or chills.  ENT:   No sore throat. No rhinorrhea. Cardiovascular:   No chest pain or syncope. Respiratory:   No dyspnea or cough. Gastrointestinal:   Negative for abdominal pain, vomiting and diarrhea.  Musculoskeletal:   Negative for focal pain or swelling All other systems reviewed  and are negative except as documented above in ROS and HPI.  ____________________________________________   PHYSICAL EXAM:  VITAL SIGNS: ED Triage Vitals  Enc Vitals Group     BP 03/26/19 1845 139/65     Pulse Rate 03/26/19 1845 62     Resp 03/26/19 1845 19     Temp 03/26/19 1845 98.8 F (37.1 C)     Temp Source 03/26/19 1845 Oral     SpO2 03/26/19 1843 98 %     Weight 03/26/19 1846 152 lb (68.9 kg)     Height 03/26/19 1846 5\' 2"  (1.575 m)     Head Circumference --      Peak Flow --      Pain Score 03/26/19 1845 10     Pain Loc --      Pain Edu? --      Excl. in [place]? --     Vital signs reviewed, nursing assessments reviewed.   Constitutional:   Alert and oriented. Non-toxic appearance. Eyes:   Conjunctivae are normal. EOMI. PERRL. ENT      Head:   Normocephalic and atraumatic.      Nose:   No congestion/rhinnorhea.       Mouth/Throat:   MMM, no pharyngeal erythema. No peritonsillar mass.       Neck:   No meningismus. Full ROM. Hematological/Lymphatic/Immunilogical:   No cervical lymphadenopathy. Cardiovascular:   RRR. Symmetric bilateral radial and DP pulses.  No murmurs. Cap refill less than 2 seconds. Respiratory:   Normal respiratory effort without tachypnea/retractions. Breath sounds are clear and equal bilaterally. No wheezes/rales/rhonchi. Gastrointestinal:   Soft and nontender. Non distended. There is no CVA tenderness.  No rebound, rigidity, or guarding. Musculoskeletal:   Normal range of motion in all extremities. No joint effusions.  No lower extremity tenderness.  No edema. Neurologic:   Normal speech and language.  Motor grossly intact. No acute focal neurologic deficits are appreciated.  Skin:    Skin is warm, dry and intact. No rash noted.  No petechiae, purpura, or bullae.  ____________________________________________    LABS (pertinent positives/negatives) (all labs ordered are listed, but only abnormal results are displayed) Labs Reviewed   COMPREHENSIVE METABOLIC PANEL - Abnormal; Notable for the following components:      Result Value   Glucose, Bld 150 (*)    BUN 41 (*)    Creatinine, Ser 1.51 (*)    Calcium 8.7 (*)    Total Protein 6.3 (*)    GFR calc non Af Amer 31 (*)    GFR calc Af Amer 36 (*)    All other components within normal limits  CBC WITH DIFFERENTIAL/PLATELET - Abnormal; Notable for the following components:   WBC 3.8 (*)    RBC 2.52 (*)    Hemoglobin 8.5 (*)    HCT  26.5 (*)    MCV 105.2 (*)    All other components within normal limits  URINALYSIS, COMPLETE (UACMP) WITH MICROSCOPIC - Abnormal; Notable for the following components:   Color, Urine YELLOW (*)    APPearance HAZY (*)    Bacteria, UA RARE (*)    All other components within normal limits  URINE CULTURE   ____________________________________________   EKG  Interpreted by me Sinus rhythm rate of 62, normal axis and intervals.  Normal QRS ST segments and T waves.  ____________________________________________    RADIOLOGY  Dg Pelvis 1-2 Views  Result Date: 03/26/2019 CLINICAL DATA:  Fall and weakness. EXAM: PELVIS - 1-2 VIEW COMPARISON:  CT abdomen dated March 23rd 2016. Pelvic radiograph dated 10/12/2012. FINDINGS: Patient is status post prior total hip arthroplasty on the right. There is increased angulation of the femoral stem. There is increasing thinning of the lateral femoral cortex at the level of the hip prosthesis. There is no evidence for a periprosthetic fracture. There is stable lucency about the femoral head. There is no definite displaced fracture involving the visualized left femur, however evaluation is limited by lack of additional views. There is a large amount of stool throughout the colon. There is no definite acute displaced fracture. IMPRESSION: 1. No acute osseous abnormality. 2. Increasing angulation involving the right hip prosthesis with increasing cortical thinning of the proximal lateral aspect of the right  femur. Findings are concerning for loosening. 3. Large amount of stool throughout the colon. Electronically Signed   By: Constance Holster M.D.   On: 03/26/2019 19:50   Ct Head Wo Contrast  Result Date: 03/26/2019 CLINICAL DATA:  Altered level of consciousness. The patient fell 2 times at home today. EXAM: CT HEAD WITHOUT CONTRAST TECHNIQUE: Contiguous axial images were obtained from the base of the skull through the vertex without intravenous contrast. COMPARISON:  CT scan dated 10/12/2012 FINDINGS: Brain: There is no acute hemorrhage or mass lesion. There is diffuse moderate cerebral cortical and cerebellar atrophy. 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 Vascular: No hyperdense vessel or unexpected calcification. Skull: Normal. Negative for fracture or focal lesion. Sinuses/Orbits: Partial opacification of the right side of the frontal sinus and anterior ethmoid air cells, new since 2014. Orbits demonstrate no significant abnormality. Other: None. IMPRESSION: 1. No acute intracranial abnormality. 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 2. New partial opacification of the right side of the frontal sinus and anterior ethmoid air cells. I doubt that this is acute. Electronically Signed   By: Lorriane Shire M.D.   On: 03/26/2019 20:12   Dg Chest Portable 1 View  Result Date: 03/26/2019 CLINICAL DATA:  Fall, weakness EXAM: PORTABLE CHEST 1 VIEW COMPARISON:  05/20/2014 FINDINGS: Cardiomegaly. Mild, coarse bilateral interstitial pulmonary opacity. The visualized skeletal structures are unremarkable. IMPRESSION: Cardiomegaly. Mild, coarse bilateral interstitial pulmonary opacity, likely chronic interstitial change. There may be mild pulmonary edema. Electronically Signed   By: Eddie Candle M.D.   On: 03/26/2019 19:47    ____________________________________________   PROCEDURES Procedures  ____________________________________________  DIFFERENTIAL  DIAGNOSIS   Intracranial hemorrhage, UTI, pneumonia, dehydration, electrolyte abnormality, hypoglycemia, hip fracture  CLINICAL IMPRESSION / ASSESSMENT AND PLAN / ED COURSE  Medications ordered in the ED: Medications - No data to display  Pertinent labs & imaging results that were available during my care of the patient were reviewed by me and considered in my medical decision making (see chart for details).  Dane H Leighton was evaluated  in Emergency Department on 03/26/2019 for the symptoms described in the history of present illness. She was evaluated in the context of the global COVID-19 pandemic, which necessitated consideration that the patient might be at risk for infection with the SARS-CoV-2 virus that causes COVID-19. Institutional protocols and algorithms that pertain to the evaluation of patients at risk for COVID-19 are in a state of rapid change based on information released by regulatory bodies including the CDC and federal and state organizations. These policies and algorithms were followed during the patient's care in the ED.   Patient presents with a fall, currently asymptomatic and oriented.  No significant findings on exam, vital signs are reassuring.  Work-up including CT head, labs, urinalysis chest x-ray and pelvic x-ray are all unremarkable.  She appears to be at her baseline state of health.  She has some CKD which is at baseline.  Stable for outpatient follow-up.  No evidence of acute encephalopathy delirium sepsis or cardiopulmonary pathology.      ____________________________________________   FINAL CLINICAL IMPRESSION(S) / ED DIAGNOSES    Final diagnoses:  Fall in home, initial encounter  Chronic kidney disease, unspecified CKD stage     ED Discharge Orders    None      Portions of this note were generated with dragon dictation software. Dictation errors may occur despite best attempts at proofreading.   Carrie Mew, MD 03/26/19 2314

## 2019-03-26 NOTE — ED Triage Notes (Signed)
PT to ED via EMS from home where she lives with only her husband. PT c/o fall x2 times today in separate incidents while trying to use bedside commode. EMS reports confusion. PT is AO.4 at this time with no complaints.

## 2019-03-27 NOTE — ED Notes (Addendum)
Unable to sign d/c

## 2019-03-30 LAB — URINE CULTURE: Culture: >=100000 — AB

## 2020-07-24 IMAGING — DX PORTABLE CHEST - 1 VIEW
1 series · 1 of 1 positions shown · non-contrast
Comparison: 05/20/2014

CLINICAL DATA: Fall, weakness

EXAM:
PORTABLE CHEST 1 VIEW

[chest ap]
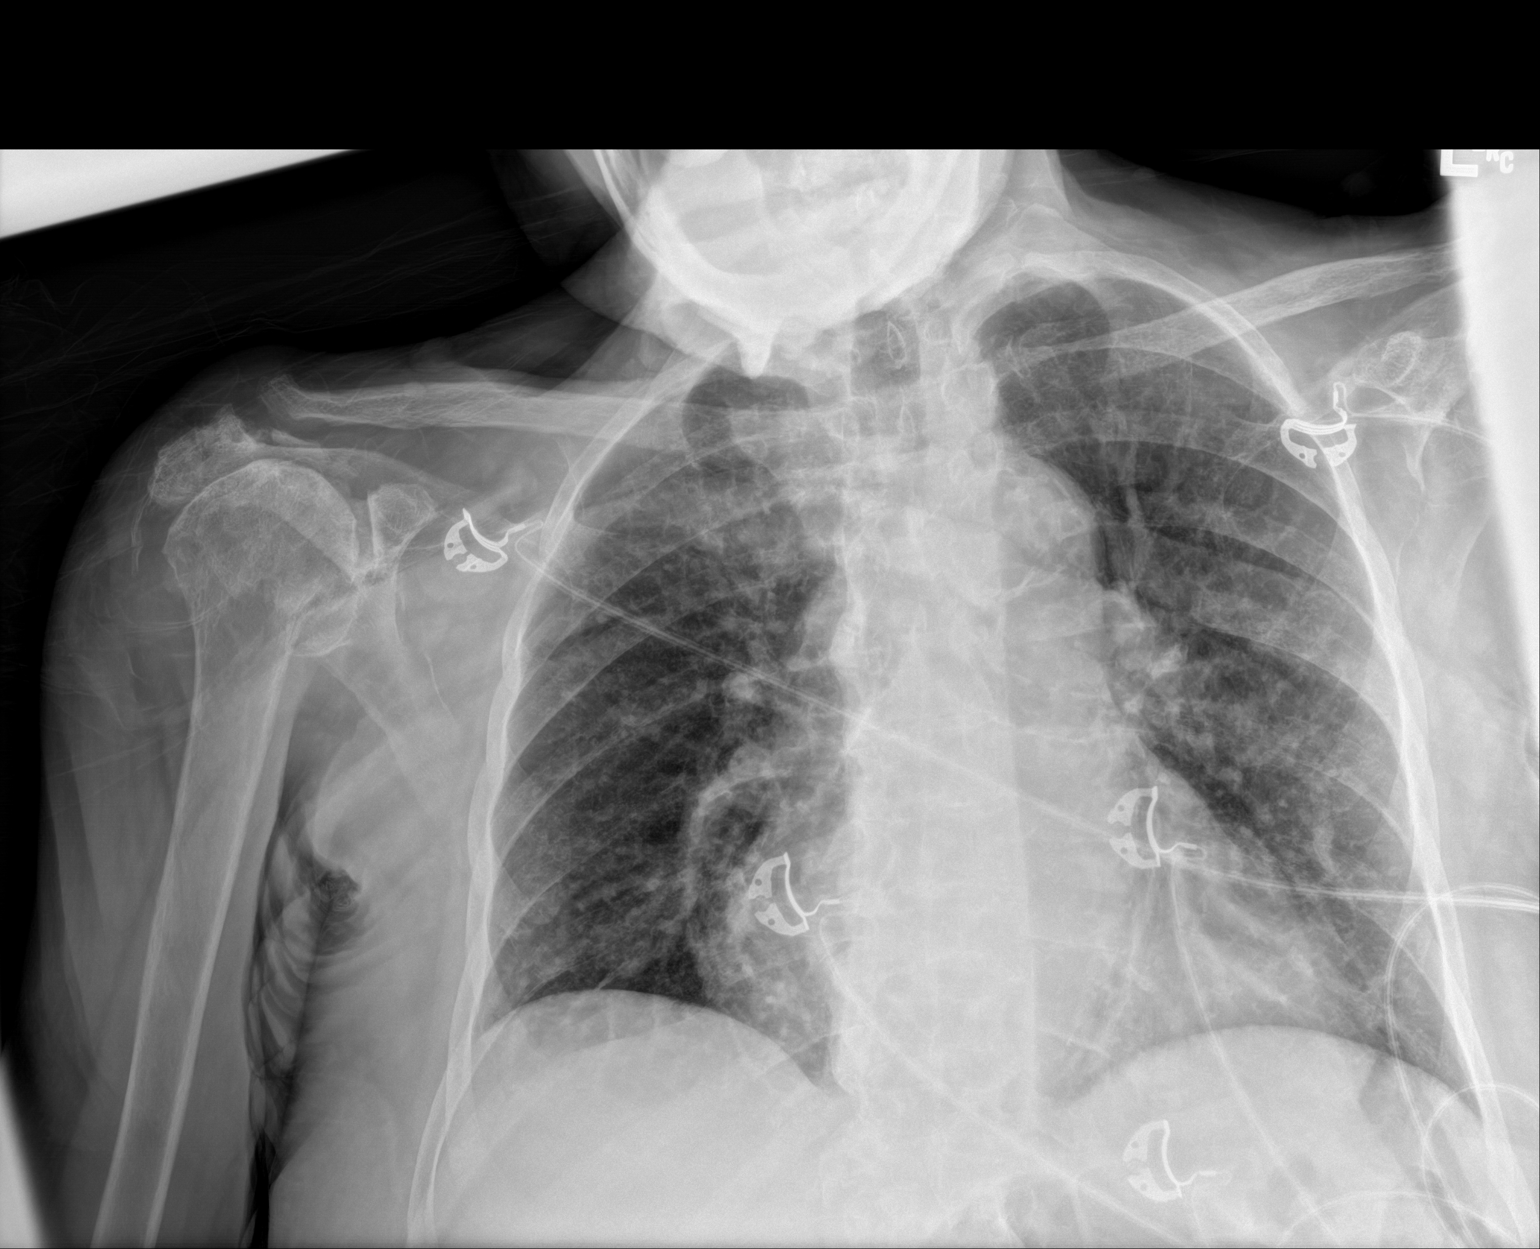

[1 of 1 positions shown; findings below may reference images not displayed]

FINDINGS: Cardiomegaly. Mild, coarse bilateral interstitial pulmonary opacity.
The visualized skeletal structures are unremarkable.
IMPRESSION: Cardiomegaly. Mild, coarse bilateral interstitial pulmonary opacity,
likely chronic interstitial change. There may be mild pulmonary
edema.

## 2020-07-24 IMAGING — CT CT HEAD WITHOUT CONTRAST
3 series · 15 of 46 positions shown, 18 images · non-contrast
Comparison: CT scan dated 10/12/2012

CLINICAL DATA: Altered level of consciousness. The patient fell 2
times at home today.

EXAM:
CT HEAD WITHOUT CONTRAST
TECHNIQUE: Contiguous axial images were obtained from the base of the skull
through the vertex without intravenous contrast.

[Series 2: head wo · axial · 0.40mm/px · z∈[+436,+556]mm · 9 of 29 slices shown, 12 images]
[im 3/29  brain]
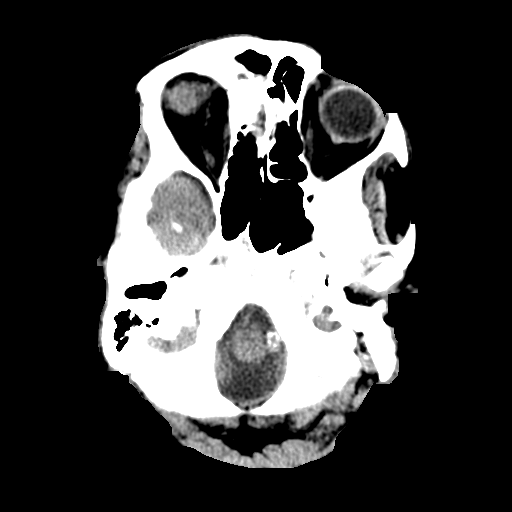
[im 3/29  bone]
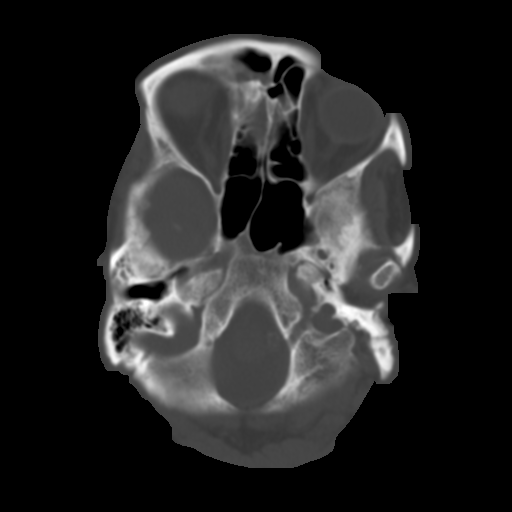
[im 6/29  brain]
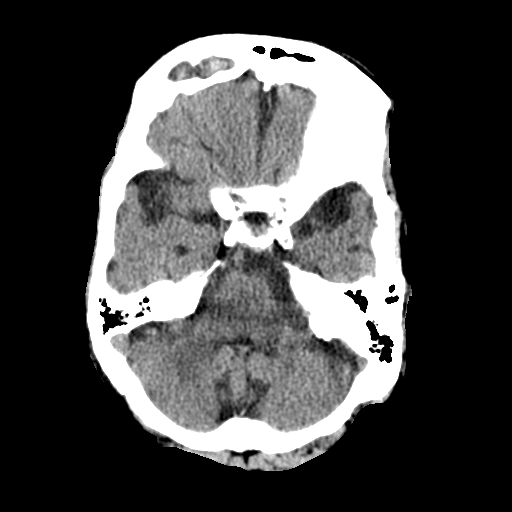
[im 9/29  brain]
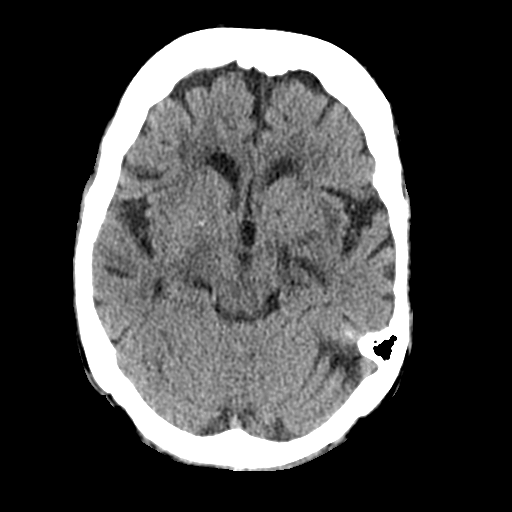
[im 12/29  brain]
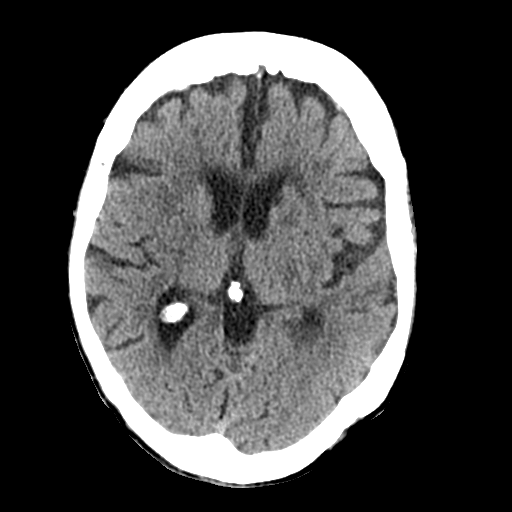
[im 15/29  brain]
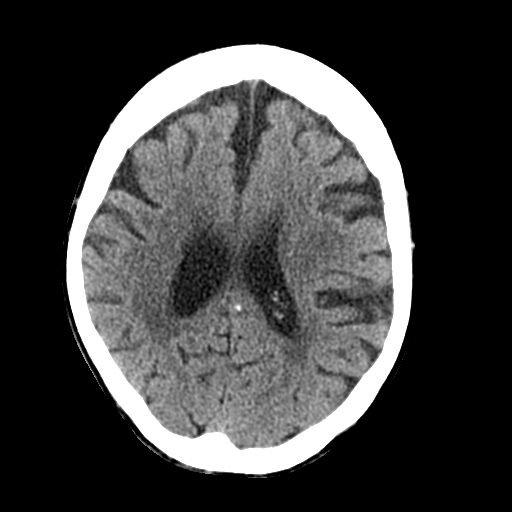
[im 15/29  bone]
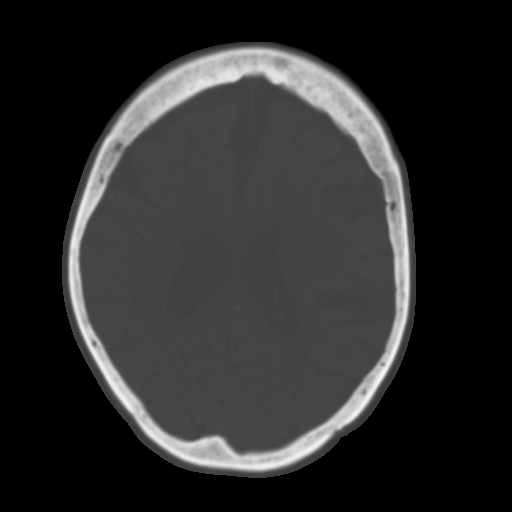
[im 18/29  brain]
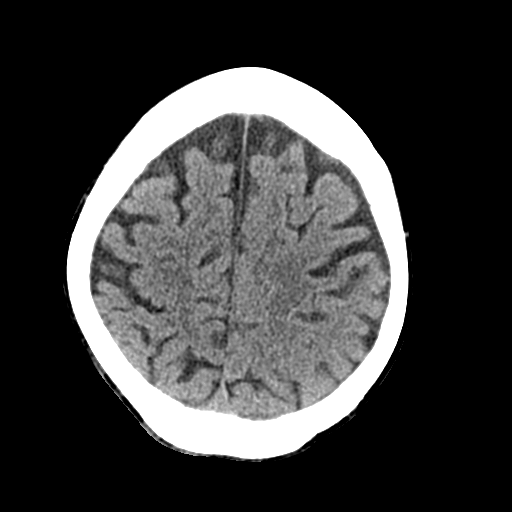
[im 21/29  brain]
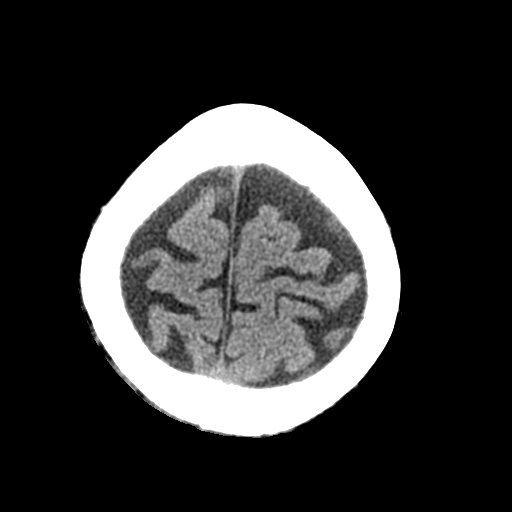
[im 24/29  brain]
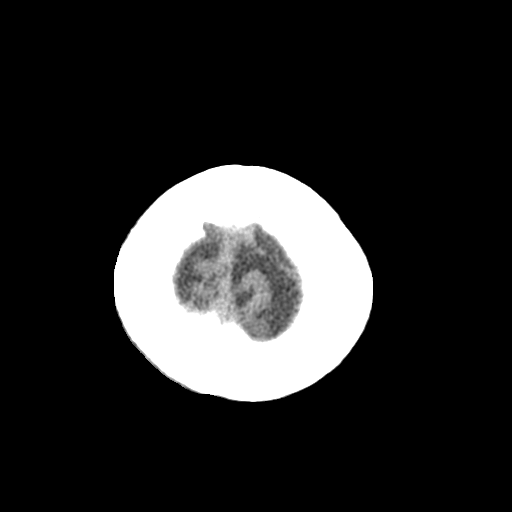
[im 27/29  brain]
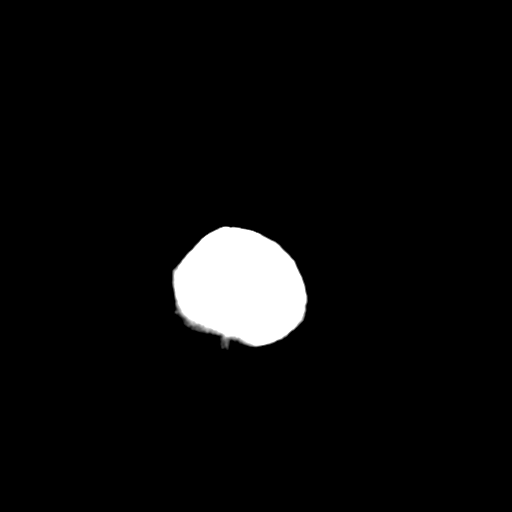
[im 27/29  bone]
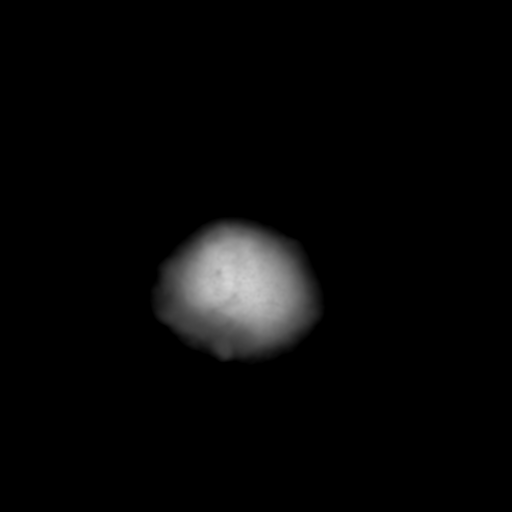

[Series 4: coronal soft tissue · coronal · 0.29mm/px · 3 of 63 slices shown]
[im 21/63  brain]
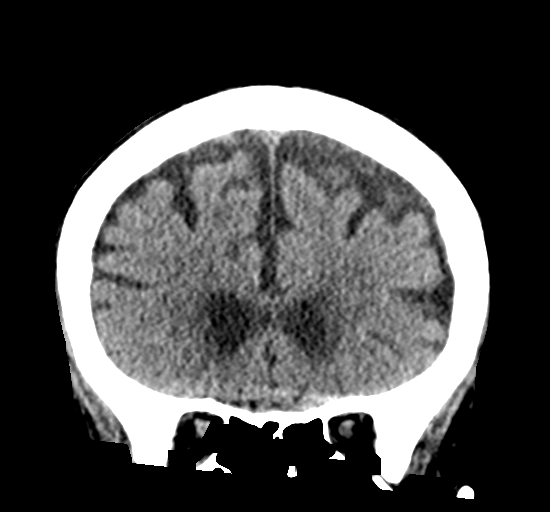
[im 28/63  brain]
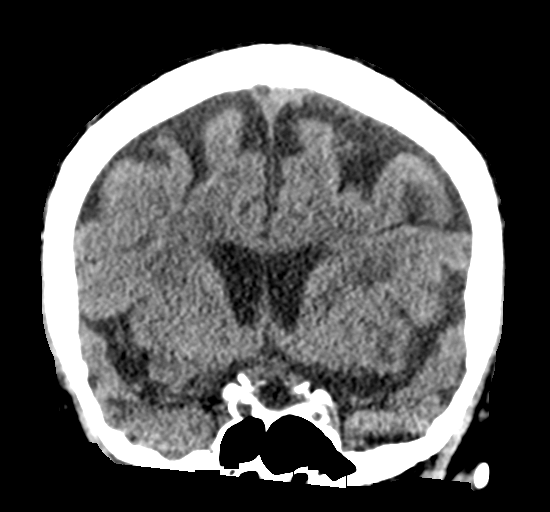
[im 35/63  brain]
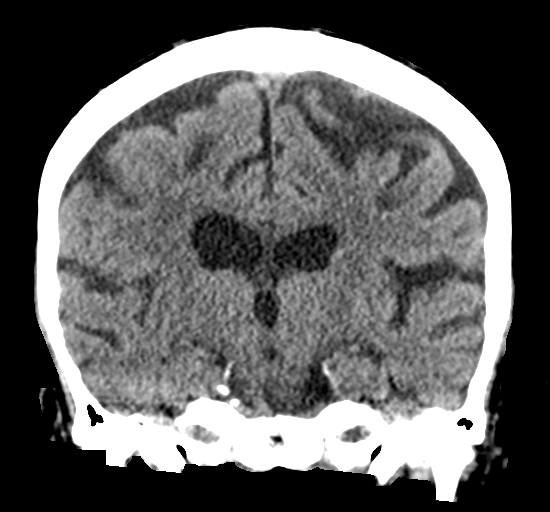

[Series 5: sagittal soft tissue · sagittal · 0.29mm/px · 3 of 49 slices shown]
[im 17/49  brain]
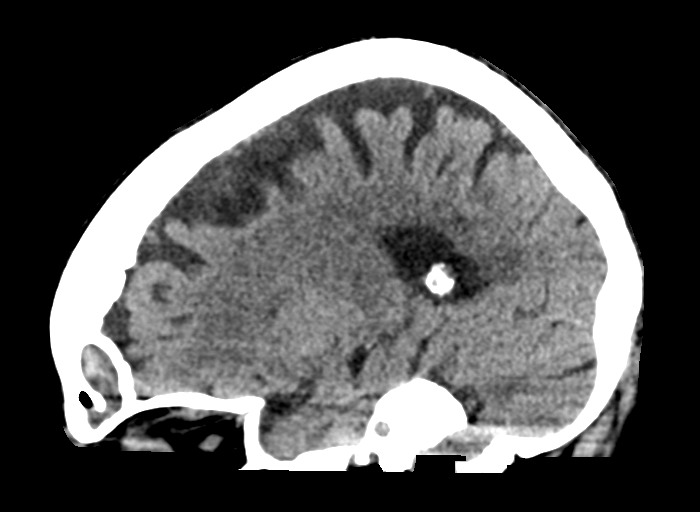
[im 25/49  brain]
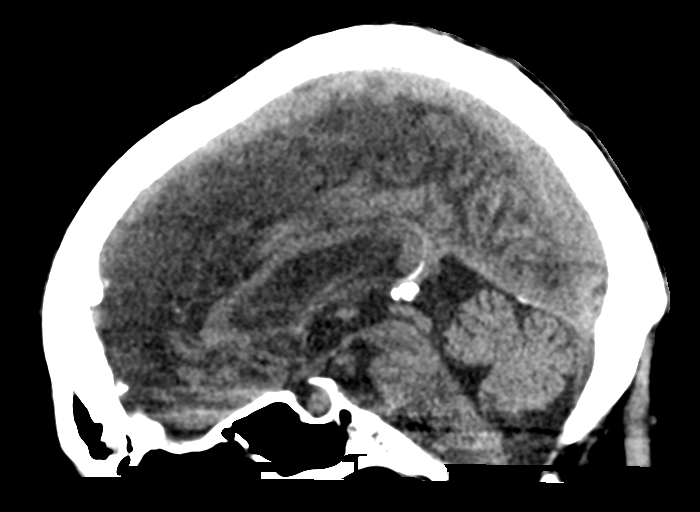
[im 33/49  brain]
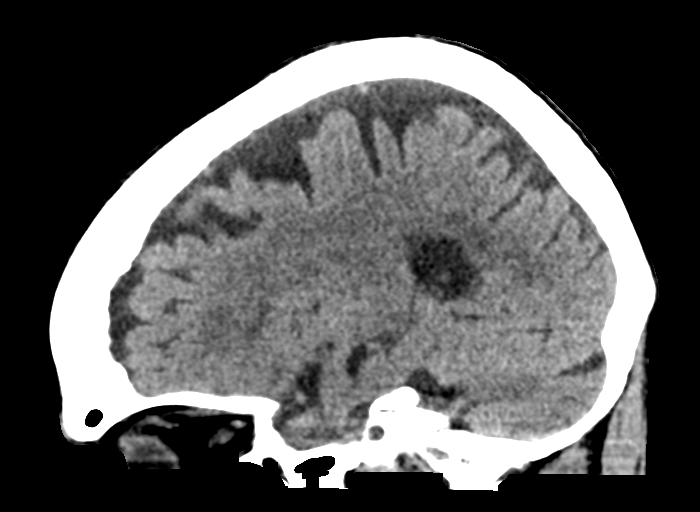

[15 of 46 positions shown; findings below may reference images not displayed]

FINDINGS: Brain: There is no acute hemorrhage or mass lesion. There is diffuse
moderate cerebral cortical and cerebellar atrophy. Multiple old
small white matter infarcts, slightly more prominent than in 0263.

Vascular: No hyperdense vessel or unexpected calcification.

Skull: Normal. Negative for fracture or focal lesion.

Sinuses/Orbits: Partial opacification of the right side of the
frontal sinus and anterior ethmoid air cells, new since 0263. Orbits
demonstrate no significant abnormality.

Other: None.
IMPRESSION: 1. No acute intracranial abnormality. Atrophy with multiple old
white matter infarcts.
2. New partial opacification of the right side of the frontal sinus
and anterior ethmoid air cells. I doubt that this is acute.

## 2020-08-02 DEATH — deceased
